# Patient Record
Sex: Male | Born: 1980 | Race: Black or African American | Hispanic: No | Marital: Single | Smoking: Former smoker
Health system: Southern US, Community
[De-identification: ages and names within clinical notes are randomized; demographics above are authoritative.]

---

## 2007-06-02 ENCOUNTER — Emergency Department (HOSPITAL_COMMUNITY): Admission: EM | Admit: 2007-06-02 | Discharge: 2007-06-02 | Payer: Self-pay | Admitting: Emergency Medicine

## 2007-06-04 ENCOUNTER — Emergency Department (HOSPITAL_COMMUNITY): Admission: EM | Admit: 2007-06-04 | Discharge: 2007-06-04 | Payer: Self-pay | Admitting: Emergency Medicine

## 2007-06-17 ENCOUNTER — Emergency Department (HOSPITAL_COMMUNITY): Admission: EM | Admit: 2007-06-17 | Discharge: 2007-06-17 | Payer: Self-pay | Admitting: Emergency Medicine

## 2008-02-12 ENCOUNTER — Other Ambulatory Visit: Payer: Self-pay

## 2008-02-12 ENCOUNTER — Emergency Department: Payer: Self-pay | Admitting: Emergency Medicine

## 2010-09-25 ENCOUNTER — Encounter: Payer: Self-pay | Admitting: *Deleted

## 2011-02-25 ENCOUNTER — Emergency Department (HOSPITAL_COMMUNITY): Payer: BC Managed Care – PPO

## 2011-02-25 ENCOUNTER — Emergency Department (HOSPITAL_COMMUNITY)
Admission: EM | Admit: 2011-02-25 | Discharge: 2011-02-25 | Disposition: A | Discharge status: Home / Self Care | Payer: BC Managed Care – PPO | Attending: Emergency Medicine | Admitting: Emergency Medicine

## 2011-02-25 DIAGNOSIS — IMO0002 Reserved for concepts with insufficient information to code with codable children: Secondary | ICD-10-CM | POA: Insufficient documentation

## 2011-02-25 DIAGNOSIS — M79609 Pain in unspecified limb: Secondary | ICD-10-CM | POA: Insufficient documentation

## 2011-03-09 ENCOUNTER — Other Ambulatory Visit: Payer: Self-pay | Admitting: Family Medicine

## 2011-03-09 DIAGNOSIS — R222 Localized swelling, mass and lump, trunk: Secondary | ICD-10-CM

## 2011-03-13 ENCOUNTER — Ambulatory Visit
Admission: RE | Admit: 2011-03-13 | Discharge: 2011-03-13 | Disposition: A | Discharge status: Home / Self Care | Payer: BC Managed Care – PPO | Source: Ambulatory Visit | Attending: Family Medicine | Admitting: Family Medicine

## 2011-03-13 DIAGNOSIS — R222 Localized swelling, mass and lump, trunk: Secondary | ICD-10-CM

## 2019-05-01 ENCOUNTER — Encounter (HOSPITAL_COMMUNITY): Payer: Self-pay

## 2019-05-01 ENCOUNTER — Emergency Department (HOSPITAL_COMMUNITY): Payer: Self-pay

## 2019-05-01 ENCOUNTER — Other Ambulatory Visit: Payer: Self-pay

## 2019-05-01 ENCOUNTER — Emergency Department (HOSPITAL_COMMUNITY)
Admission: EM | Admit: 2019-05-01 | Discharge: 2019-05-02 | Disposition: A | Discharge status: Home / Self Care | Payer: Self-pay | Attending: Emergency Medicine | Admitting: Emergency Medicine

## 2019-05-01 DIAGNOSIS — S82831A Other fracture of upper and lower end of right fibula, initial encounter for closed fracture: Secondary | ICD-10-CM

## 2019-05-01 DIAGNOSIS — S82891A Other fracture of right lower leg, initial encounter for closed fracture: Secondary | ICD-10-CM

## 2019-05-01 DIAGNOSIS — S82301A Unspecified fracture of lower end of right tibia, initial encounter for closed fracture: Secondary | ICD-10-CM

## 2019-05-01 DIAGNOSIS — F909 Attention-deficit hyperactivity disorder, unspecified type: Secondary | ICD-10-CM | POA: Insufficient documentation

## 2019-05-01 DIAGNOSIS — M25571 Pain in right ankle and joints of right foot: Secondary | ICD-10-CM

## 2019-05-01 DIAGNOSIS — G43009 Migraine without aura, not intractable, without status migrainosus: Secondary | ICD-10-CM | POA: Insufficient documentation

## 2019-05-01 DIAGNOSIS — F913 Oppositional defiant disorder: Secondary | ICD-10-CM | POA: Insufficient documentation

## 2019-05-01 MED ORDER — OXYCODONE HCL 5 MG PO TABS: 5.0000 mg | ORAL_TABLET | Freq: Four times a day (QID) | ORAL | 0 refills | Status: AC | PRN

## 2019-05-01 MED ORDER — MORPHINE SULFATE (PF) 4 MG/ML IV SOLN
4.0000 mg | Freq: Once | INTRAVENOUS | Status: AC
  Administered 2019-05-01: 22:00:00 4 mg via INTRAVENOUS
  Filled 2019-05-01: qty 1

## 2019-05-01 MED ORDER — MORPHINE SULFATE (PF) 4 MG/ML IV SOLN
4.0000 mg | Freq: Once | INTRAVENOUS | Status: AC
  Administered 2019-05-01: 4 mg via INTRAVENOUS
  Filled 2019-05-01: qty 1

## 2019-05-01 MED ORDER — MORPHINE SULFATE (PF) 4 MG/ML IV SOLN
4.0000 mg | Freq: Once | INTRAVENOUS | Status: AC
  Administered 2019-05-01: 21:00:00 4 mg via INTRAVENOUS
  Filled 2019-05-01: qty 1

## 2019-05-01 MED ORDER — OXYCODONE-ACETAMINOPHEN 5-325 MG PO TABS
1.0000 | ORAL_TABLET | Freq: Once | ORAL | Status: AC
  Administered 2019-05-01: 1 via ORAL
  Filled 2019-05-01: qty 1

## 2019-05-01 MED ORDER — FENTANYL CITRATE (PF) 100 MCG/2ML IJ SOLN
50.0000 ug | Freq: Once | INTRAMUSCULAR | Status: AC
  Administered 2019-05-01: 22:00:00 50 ug via INTRAVENOUS
  Filled 2019-05-01: qty 2

## 2019-05-01 NOTE — Discharge Instructions (Signed)
Tomorrow morning, please call the orthopedic doctor to schedule a follow-up appointment.  You will need surgery for your ankle fracture and they will coordinate this with the orthopedic office.  Please do not bear weight on your ankle.  Please keep the splint clean and dry, if this gets wet and will lose its integrity.  If you develop severe pain, swelling, numbness or tingling in your foot, ankle, please return to ER for recheck.  Recommendation Tylenol Motrin scheduled for the next couple days as discussed.  For breakthrough pain, please use oxycodone.  Please note that this pain medication can make you drowsy and should not be taken while operating heavy machinery or driving.

## 2019-05-01 NOTE — ED Notes (Signed)
Ernest Mcfarland [id number]
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
## 2019-05-01 NOTE — Progress Notes (Signed)
Orthopedic Tech Progress Note Patient Details:  Ernest Mcfarland [birth year] [id number]  Ortho Devices Type of Ortho Device: Post (short leg) splint Ortho Device/Splint Interventions: Adjustment, Application, Ordered   Post Interventions Patient Tolerated: Well Instructions Provided: Poper ambulation with device, Care of device, Adjustment of device      Post Interventions Patient Tolerated: Well Instructions Provided: Poper ambulation with device, Care of device, Adjustment of device   Melony Overly T 05/01/2019, 10:30 PM

## 2019-05-09 NOTE — Pre-Procedure Instructions (Addendum)
Marland McalpineVictor D Decamp  05/09/2019      [id number] [place], [place] - [address] AT [facility] [address] [facility] [address] [place] [id number] Phone: [phone] Fax: [phone]  [id number] [place], [place] - [address] AT [facility] [address] [place] [address] Phone: [phone] Fax: [phone]    Your procedure is scheduled on Sept. 10  [facility]:30 [facility].M.  Call this number if you have problems the morning of surgery:  [phone]   Remember:  Do not eat after midnight.  You may drink clear liquids until 7:30 A.M.  Clear liquids allowed are:                    Water, Juice (non-citric and without pulp), Carbonated beverages, Clear Tea, Black Coffee only, Plain Jell-O only, Gatorade and Plain Popsicles only        Please complete your PRE-SURGERY ENSURE that was provided to you by 7:30 the morning of surgery.  Please, if able, drink it in one setting. DO NOT SIP.    Take these medicines the morning of surgery with A SIP OF WATER -- none            7 days prior to surgery STOP taking any Aspirin (unless otherwise instructed by your surgeon), Aleve, Naproxen, Ibuprofen, Motrin, Advil, Goody's, BC's, all herbal medications, fish oil, and all vitamins.    Do not wear jewelry.  Do not wear lotions, powders, or perfumes, or deodorant.  Do not shave 48 hours prior to surgery.  Men may shave face and neck.  Do not bring valuables to the hospital.  [facility] is not responsible for any belongings or valuables.  Contacts, dentures or bridgework may not be worn into surgery.  Leave your suitcase in the car.  After surgery it may be brought to your room.  For patients admitted to the hospital, discharge time will be determined by your treatment team.  Patients discharged the day of surgery will not be allowed to drive home.    Special instructions:   - Preparing For Surgery  Before surgery, you can play an important role. Because skin is not sterile, your skin needs to be as free of germs as possible. You can reduce the number of germs on your skin by washing with CHG (chlorahexidine gluconate) Soap before surgery.  CHG is an antiseptic cleaner which kills germs and bonds with the skin to continue killing germs even after washing.    Oral Hygiene is also important to reduce your risk of infection.  Remember - BRUSH YOUR TEETH THE MORNING OF SURGERY WITH YOUR REGULAR TOOTHPASTE  Please do not use if you have an allergy to CHG or antibacterial soaps. If your skin becomes reddened/irritated stop using the CHG.  Do not shave (including legs and underarms) for at least 48 hours prior to first CHG shower. It is OK to shave your face.  Please follow these instructions carefully.   1. Shower the NIGHT BEFORE SURGERY and the MORNING OF SURGERY with CHG.   2. If you chose to wash your hair, wash your hair first as usual with your normal shampoo.  3. After you shampoo, rinse your hair and body thoroughly to remove the shampoo.  4. Use CHG as you would any other liquid soap. You can apply  CHG directly to the skin and wash gently with a scrungie or a clean washcloth.   5. Apply the CHG Soap to your body ONLY FROM THE NECK DOWN.  Do not use on open wounds or open sores. Avoid contact with your eyes, ears, mouth and genitals (private parts). Wash Face and genitals (private parts)  with your normal soap.  6. Wash thoroughly, paying special attention to the area where your surgery will be performed.  7. Thoroughly rinse your body with warm water from the neck down.  8. DO NOT shower/wash with your normal soap after using and rinsing off the CHG Soap.  9. Pat yourself dry with a CLEAN TOWEL.  10. Wear CLEAN PAJAMAS to bed the night before surgery, wear comfortable clothes the morning of surgery  11. Place CLEAN SHEETS on your bed the  night of your first shower and DO NOT SLEEP WITH PETS.    Day of Surgery:  Do not apply any deodorants/lotions.  Please wear clean clothes to the hospital/surgery center.   Remember to brush your teeth WITH YOUR REGULAR TOOTHPASTE.    Please read over the following fact sheets that you were given. Coughing and Deep Breathing and Surgical Site Infection Prevention

## 2019-05-13 ENCOUNTER — Other Ambulatory Visit: Payer: Self-pay

## 2019-05-13 ENCOUNTER — Encounter (HOSPITAL_COMMUNITY)
Admission: RE | Admit: 2019-05-13 | Discharge: 2019-05-13 | Disposition: A | Discharge status: Home / Self Care | Payer: Medicaid Other | Source: Ambulatory Visit | Attending: Orthopedic Surgery | Admitting: Orthopedic Surgery

## 2019-05-13 ENCOUNTER — Other Ambulatory Visit (HOSPITAL_COMMUNITY)
Admission: RE | Admit: 2019-05-13 | Discharge: 2019-05-13 | Disposition: A | Discharge status: Home / Self Care | Payer: HRSA Program | Source: Ambulatory Visit | Attending: Orthopedic Surgery | Admitting: Orthopedic Surgery

## 2019-05-13 ENCOUNTER — Encounter (HOSPITAL_COMMUNITY): Payer: Self-pay

## 2019-05-13 DIAGNOSIS — Z20828 Contact with and (suspected) exposure to other viral communicable diseases: Secondary | ICD-10-CM | POA: Insufficient documentation

## 2019-05-13 DIAGNOSIS — Z01812 Encounter for preprocedural laboratory examination: Secondary | ICD-10-CM | POA: Insufficient documentation

## 2019-05-13 DIAGNOSIS — S82871A Displaced pilon fracture of right tibia, initial encounter for closed fracture: Secondary | ICD-10-CM | POA: Diagnosis present

## 2019-05-13 DIAGNOSIS — S82831A Other fracture of upper and lower end of right fibula, initial encounter for closed fracture: Secondary | ICD-10-CM | POA: Diagnosis not present

## 2019-05-13 DIAGNOSIS — Z87891 Personal history of nicotine dependence: Secondary | ICD-10-CM | POA: Diagnosis not present

## 2019-05-13 LAB — SURGICAL PCR SCREEN
MRSA, PCR: NEGATIVE
Staphylococcus aureus: POSITIVE — AB

## 2019-05-13 LAB — HEMOGLOBIN: Hemoglobin: 14.6 g/dL (ref 13.0–17.0)

## 2019-05-13 NOTE — Progress Notes (Signed)
PCP - none Cardiologist - na  Chest x-ray - na EKG - na Stress Test - na ECHO - na Cardiac Cath - na  Sleep Study - na CPAP - na  Fasting Blood Sugar - na Checks Blood Sugar _____ times a day  Blood Thinner Instructions: Aspirin Instructions:na  Anesthesia review:   Patient denies shortness of breath, fever, cough and chest pain at PAT appointment   Patient verbalized understanding of instructions that were given to them at the PAT appointment. Patient was also instructed that they will need to review over the PAT instructions again at home before surgery.

## 2019-05-14 LAB — SARS CORONAVIRUS 2 (TAT 6-24 HRS): SARS Coronavirus 2: NEGATIVE

## 2019-05-15 ENCOUNTER — Ambulatory Visit (HOSPITAL_COMMUNITY): Payer: Medicaid Other | Admitting: Certified Registered"

## 2019-05-15 ENCOUNTER — Other Ambulatory Visit: Payer: Self-pay

## 2019-05-15 ENCOUNTER — Encounter (HOSPITAL_COMMUNITY): Payer: Self-pay | Admitting: Anesthesiology

## 2019-05-15 ENCOUNTER — Encounter (HOSPITAL_COMMUNITY)
Admission: RE | Disposition: A | Discharge status: Home / Self Care | Payer: Self-pay | Source: Home / Self Care | Attending: Orthopedic Surgery

## 2019-05-15 ENCOUNTER — Ambulatory Visit (HOSPITAL_COMMUNITY): Payer: Medicaid Other

## 2019-05-15 ENCOUNTER — Ambulatory Visit (HOSPITAL_COMMUNITY)
Admission: RE | Admit: 2019-05-15 | Discharge: 2019-05-15 | Disposition: A | Discharge status: Home / Self Care | Payer: Medicaid Other | Attending: Orthopedic Surgery | Admitting: Orthopedic Surgery

## 2019-05-15 DIAGNOSIS — S82831A Other fracture of upper and lower end of right fibula, initial encounter for closed fracture: Secondary | ICD-10-CM | POA: Insufficient documentation

## 2019-05-15 DIAGNOSIS — S82871A Displaced pilon fracture of right tibia, initial encounter for closed fracture: Secondary | ICD-10-CM | POA: Insufficient documentation

## 2019-05-15 DIAGNOSIS — Z87891 Personal history of nicotine dependence: Secondary | ICD-10-CM | POA: Diagnosis not present

## 2019-05-15 DIAGNOSIS — Z419 Encounter for procedure for purposes other than remedying health state, unspecified: Secondary | ICD-10-CM

## 2019-05-15 HISTORY — PX: ORIF FIBULA FRACTURE: SHX5114

## 2019-05-15 SURGERY — OPEN REDUCTION INTERNAL FIXATION (ORIF) FIBULA FRACTURE
Anesthesia: General | Laterality: Right

## 2019-05-15 MED ORDER — MIDAZOLAM HCL 5 MG/5ML IJ SOLN
INTRAMUSCULAR | Status: DC | PRN
  Administered 2019-05-15: 2 mg via INTRAVENOUS

## 2019-05-15 MED ORDER — BUPIVACAINE HCL (PF) 0.25 % IJ SOLN
INTRAMUSCULAR | Status: AC
  Filled 2019-05-15: qty 30

## 2019-05-15 MED ORDER — ONDANSETRON 4 MG PO TBDP: 4.0000 mg | ORAL_TABLET | Freq: Three times a day (TID) | ORAL | 0 refills | Status: DC | PRN

## 2019-05-15 MED ORDER — PROMETHAZINE HCL 25 MG/ML IJ SOLN: 6.2500 mg | INTRAMUSCULAR | Status: DC | PRN

## 2019-05-15 MED ORDER — LACTATED RINGERS IV SOLN
INTRAVENOUS | Status: DC
  Administered 2019-05-15: 10:00:00 via INTRAVENOUS

## 2019-05-15 MED ORDER — 0.9 % SODIUM CHLORIDE (POUR BTL) OPTIME
TOPICAL | Status: DC | PRN
  Administered 2019-05-15: 1000 mL

## 2019-05-15 MED ORDER — HYDROMORPHONE HCL 1 MG/ML IJ SOLN
INTRAMUSCULAR | Status: AC
  Filled 2019-05-15: qty 1

## 2019-05-15 MED ORDER — HYDROMORPHONE HCL 1 MG/ML IJ SOLN
0.2500 mg | INTRAMUSCULAR | Status: DC | PRN
  Administered 2019-05-15 (×2): 0.5 mg via INTRAVENOUS
  Administered 2019-05-15 (×2): 1 mg via INTRAVENOUS

## 2019-05-15 MED ORDER — HYDROMORPHONE HCL 1 MG/ML IJ SOLN
INTRAMUSCULAR | Status: AC
  Filled 2019-05-15: qty 0.5

## 2019-05-15 MED ORDER — BUPIVACAINE-EPINEPHRINE (PF) 0.5% -1:200000 IJ SOLN
INTRAMUSCULAR | Status: DC | PRN
  Administered 2019-05-15: 30 mL via PERINEURAL

## 2019-05-15 MED ORDER — DIPHENHYDRAMINE HCL 50 MG/ML IJ SOLN
25.0000 mg | Freq: Once | INTRAMUSCULAR | Status: AC
  Administered 2019-05-15: 15:00:00 25 mg via INTRAVENOUS

## 2019-05-15 MED ORDER — LIDOCAINE 2% (20 MG/ML) 5 ML SYRINGE
INTRAMUSCULAR | Status: DC | PRN
  Administered 2019-05-15: 100 mg via INTRAVENOUS

## 2019-05-15 MED ORDER — HYDROMORPHONE HCL 1 MG/ML IJ SOLN
0.5000 mg | INTRAMUSCULAR | Status: DC | PRN
  Administered 2019-05-15: 1 mg via INTRAVENOUS

## 2019-05-15 MED ORDER — CEFAZOLIN SODIUM-DEXTROSE 2-4 GM/100ML-% IV SOLN: 2.0000 g | INTRAVENOUS | Status: DC

## 2019-05-15 MED ORDER — PROPOFOL 10 MG/ML IV BOLUS
INTRAVENOUS | Status: AC
  Filled 2019-05-15: qty 20

## 2019-05-15 MED ORDER — KETAMINE HCL 50 MG/5ML IJ SOSY
PREFILLED_SYRINGE | INTRAMUSCULAR | Status: AC
  Filled 2019-05-15: qty 5

## 2019-05-15 MED ORDER — ONDANSETRON HCL 4 MG/2ML IJ SOLN
INTRAMUSCULAR | Status: DC | PRN
  Administered 2019-05-15: 4 mg via INTRAVENOUS

## 2019-05-15 MED ORDER — DEXAMETHASONE SODIUM PHOSPHATE 10 MG/ML IJ SOLN
INTRAMUSCULAR | Status: DC | PRN
  Administered 2019-05-15: 10 mg via INTRAVENOUS

## 2019-05-15 MED ORDER — FENTANYL CITRATE (PF) 250 MCG/5ML IJ SOLN
INTRAMUSCULAR | Status: DC | PRN
  Administered 2019-05-15: 25 ug via INTRAVENOUS
  Administered 2019-05-15: 50 ug via INTRAVENOUS
  Administered 2019-05-15: 250 ug via INTRAVENOUS
  Administered 2019-05-15 (×2): 25 ug via INTRAVENOUS

## 2019-05-15 MED ORDER — FENTANYL CITRATE (PF) 250 MCG/5ML IJ SOLN
INTRAMUSCULAR | Status: AC
  Filled 2019-05-15: qty 5

## 2019-05-15 MED ORDER — MEPERIDINE HCL 25 MG/ML IJ SOLN: 6.2500 mg | INTRAMUSCULAR | Status: DC | PRN

## 2019-05-15 MED ORDER — PROPOFOL 10 MG/ML IV BOLUS
INTRAVENOUS | Status: DC | PRN
  Administered 2019-05-15: 20 mg via INTRAVENOUS
  Administered 2019-05-15: 280 mg via INTRAVENOUS
  Administered 2019-05-15: 20 mg via INTRAVENOUS

## 2019-05-15 MED ORDER — HYDROMORPHONE HCL 2 MG PO TABS: 2.0000 mg | ORAL_TABLET | ORAL | 0 refills | Status: AC | PRN

## 2019-05-15 MED ORDER — DIPHENHYDRAMINE HCL 50 MG/ML IJ SOLN
INTRAMUSCULAR | Status: AC
  Filled 2019-05-15: qty 1

## 2019-05-15 MED ORDER — ACETAMINOPHEN 10 MG/ML IV SOLN
INTRAVENOUS | Status: DC | PRN
  Administered 2019-05-15: 1000 mg via INTRAVENOUS

## 2019-05-15 MED ORDER — KETAMINE HCL 10 MG/ML IJ SOLN
INTRAMUSCULAR | Status: DC | PRN
  Administered 2019-05-15: 10 mg via INTRAVENOUS
  Administered 2019-05-15: 30 mg via INTRAVENOUS

## 2019-05-15 MED ORDER — CHLORHEXIDINE GLUCONATE 4 % EX LIQD: 60.0000 mL | Freq: Once | CUTANEOUS | Status: DC

## 2019-05-15 MED ORDER — HYDROMORPHONE HCL 1 MG/ML IJ SOLN
INTRAMUSCULAR | Status: DC | PRN
  Administered 2019-05-15: 0.5 mg via INTRAVENOUS

## 2019-05-15 MED ORDER — MIDAZOLAM HCL 2 MG/2ML IJ SOLN
INTRAMUSCULAR | Status: AC
  Filled 2019-05-15: qty 2

## 2019-05-15 MED ORDER — HYDROMORPHONE HCL 1 MG/ML IJ SOLN
INTRAMUSCULAR | Status: AC
  Filled 2019-05-15: qty 2

## 2019-05-15 MED ORDER — BUPIVACAINE-EPINEPHRINE (PF) 0.25% -1:200000 IJ SOLN
INTRAMUSCULAR | Status: DC | PRN
  Administered 2019-05-15: 21 mL

## 2019-05-15 MED ORDER — BUPIVACAINE-EPINEPHRINE (PF) 0.25% -1:200000 IJ SOLN
INTRAMUSCULAR | Status: AC
  Filled 2019-05-15: qty 30

## 2019-05-15 SURGICAL SUPPLY — 68 items
ALCOHOL 70% 16 OZ (MISCELLANEOUS) ×3 IMPLANT
BANDAGE ESMARK 6X9 LF (GAUZE/BANDAGES/DRESSINGS) IMPLANT
BIT DRILL 2.5X110 QC LCP DISP (BIT) ×3 IMPLANT
BIT DRILL QC 3.5X110 (BIT) ×3 IMPLANT
BNDG COHESIVE 4X5 TAN STRL (GAUZE/BANDAGES/DRESSINGS) ×3 IMPLANT
BNDG ELASTIC 4X5.8 VLCR STR LF (GAUZE/BANDAGES/DRESSINGS) ×3 IMPLANT
BNDG ELASTIC 6X5.8 VLCR STR LF (GAUZE/BANDAGES/DRESSINGS) ×3 IMPLANT
BNDG ESMARK 6X9 LF (GAUZE/BANDAGES/DRESSINGS)
CANISTER SUCT 3000ML PPV (MISCELLANEOUS) ×3 IMPLANT
COVER SURGICAL LIGHT HANDLE (MISCELLANEOUS) ×3 IMPLANT
COVER WAND RF STERILE (DRAPES) ×3 IMPLANT
CUFF TOURN SGL QUICK 34 (TOURNIQUET CUFF)
CUFF TRNQT CYL 34X4.125X (TOURNIQUET CUFF) IMPLANT
DRAPE C-ARM 42X72 X-RAY (DRAPES) ×3 IMPLANT
DRAPE C-ARMOR (DRAPES) ×3 IMPLANT
DRAPE U-SHAPE 47X51 STRL (DRAPES) ×3 IMPLANT
DRSG ADAPTIC 3X8 NADH LF (GAUZE/BANDAGES/DRESSINGS) ×3 IMPLANT
DRSG PAD ABDOMINAL 8X10 ST (GAUZE/BANDAGES/DRESSINGS) ×3 IMPLANT
DURAPREP 26ML APPLICATOR (WOUND CARE) ×3 IMPLANT
ELECT REM PT RETURN 9FT ADLT (ELECTROSURGICAL) ×3
ELECTRODE REM PT RTRN 9FT ADLT (ELECTROSURGICAL) ×1 IMPLANT
GAUZE SPONGE 4X4 12PLY STRL (GAUZE/BANDAGES/DRESSINGS) ×3 IMPLANT
GLOVE BIO SURGEON STRL SZ7.5 (GLOVE) ×3 IMPLANT
GLOVE BIOGEL PI IND STRL 8 (GLOVE) ×1 IMPLANT
GLOVE BIOGEL PI INDICATOR 8 (GLOVE) ×2
GOWN STRL REUS W/ TWL LRG LVL3 (GOWN DISPOSABLE) ×2 IMPLANT
GOWN STRL REUS W/ TWL XL LVL3 (GOWN DISPOSABLE) ×1 IMPLANT
GOWN STRL REUS W/TWL LRG LVL3 (GOWN DISPOSABLE) ×4
GOWN STRL REUS W/TWL XL LVL3 (GOWN DISPOSABLE) ×2
GUIDEWARE NON THREAD 1.25X150 (WIRE) ×3
GUIDEWIRE NON THREAD 1.25X150 (WIRE) ×1 IMPLANT
KIT 1/3 TUB PL 2H 25M (Orthopedic Implant) ×1 IMPLANT
KIT BASIN OR (CUSTOM PROCEDURE TRAY) ×3 IMPLANT
KIT TURNOVER KIT B (KITS) ×3 IMPLANT
NEEDLE HYPO 25GX1X1/2 BEV (NEEDLE) IMPLANT
NS IRRIG 1000ML POUR BTL (IV SOLUTION) ×3 IMPLANT
PACK ORTHO EXTREMITY (CUSTOM PROCEDURE TRAY) ×3 IMPLANT
PAD ARMBOARD 7.5X6 YLW CONV (MISCELLANEOUS) ×6 IMPLANT
PAD CAST 4YDX4 CTTN HI CHSV (CAST SUPPLIES) ×1 IMPLANT
PADDING CAST COTTON 4X4 STRL (CAST SUPPLIES) ×2
PADDING CAST COTTON 6X4 STRL (CAST SUPPLIES) ×3 IMPLANT
PROS 1/3 TUB PL 2H 25M (Orthopedic Implant) ×3 IMPLANT
SCREW CANC FT ST SFS 4X14 (Screw) ×3 IMPLANT
SCREW CANC FT ST SFS 4X16 (Screw) ×3 IMPLANT
SCREW CORTEX 3.5 14MM (Screw) ×6 IMPLANT
SCREW CORTEX 3.5 24MM (Screw) ×2 IMPLANT
SCREW CORTEX 3.5 28MM (Screw) ×2 IMPLANT
SCREW CORTEX 3.5 38MM (Screw) ×2 IMPLANT
SCREW CORTEX 3.5 45MM (Screw) ×3 IMPLANT
SCREW LOCK CORT ST 3.5X14 (Screw) ×3 IMPLANT
SCREW LOCK CORT ST 3.5X24 (Screw) ×1 IMPLANT
SCREW LOCK CORT ST 3.5X28 (Screw) ×1 IMPLANT
SCREW LOCK CORT ST 3.5X38 (Screw) ×1 IMPLANT
SCREW SHORT THREAD 4.0X40 (Screw) ×3 IMPLANT
SCREW SHORT THREAD 4.0X42 (Screw) ×3 IMPLANT
SPONGE LAP 18X18 RF (DISPOSABLE) IMPLANT
SUCTION FRAZIER HANDLE 10FR (MISCELLANEOUS) ×2
SUCTION TUBE FRAZIER 10FR DISP (MISCELLANEOUS) ×1 IMPLANT
SUT ETHILON 3 0 PS 1 (SUTURE) ×6 IMPLANT
SUT VIC AB 0 CT1 27 (SUTURE)
SUT VIC AB 0 CT1 27XBRD ANBCTR (SUTURE) IMPLANT
SUT VIC AB 2-0 CT1 27 (SUTURE) ×4
SUT VIC AB 2-0 CT1 TAPERPNT 27 (SUTURE) ×2 IMPLANT
SYR CONTROL 10ML LL (SYRINGE) IMPLANT
TOWEL GREEN STERILE (TOWEL DISPOSABLE) ×6 IMPLANT
TUBE CONNECTING 12'X1/4 (SUCTIONS) ×1
TUBE CONNECTING 12X1/4 (SUCTIONS) ×2 IMPLANT
YANKAUER SUCT BULB TIP NO VENT (SUCTIONS) ×3 IMPLANT
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
## 2019-05-15 NOTE — Anesthesia Preprocedure Evaluation (Signed)
Anesthesia Evaluation  Patient identified by MRN, date of birth, ID band Patient awake    Reviewed: Allergy & Precautions, NPO status , Patient's Chart, lab work & pertinent test results  Airway Mallampati: II  TM Distance: >3 FB Neck ROM: Full    Dental no notable dental hx. (+) Teeth Intact   Pulmonary former smoker,    Pulmonary exam normal breath sounds clear to auscultation       Cardiovascular negative cardio ROS Normal cardiovascular exam Rhythm:Regular Rate:Normal     Neuro/Psych negative neurological ROS  negative psych ROS   GI/Hepatic negative GI ROS, Neg liver ROS,   Endo/Other  negative endocrine ROS  Renal/GU negative Renal ROS  negative genitourinary   Musculoskeletal Right Ankle Pilon Fx with Fx Fibula   Abdominal   Peds  Hematology negative hematology ROS (+)   Anesthesia Other Findings   Reproductive/Obstetrics                             Anesthesia Physical Anesthesia Plan  ASA: I  Anesthesia Plan: General   Post-op Pain Management:    Induction: Intravenous  PONV Risk Score and Plan: 3 and Midazolam, Ondansetron, Treatment may vary due to age or medical condition and Dexamethasone  Airway Management Planned: LMA  Additional Equipment:   Intra-op Plan:   Post-operative Plan: Extubation in OR  Informed Consent: I have reviewed the patients History and Physical, chart, labs and discussed the procedure including the risks, benefits and alternatives for the proposed anesthesia with the patient or authorized representative who has indicated his/her understanding and acceptance.     Dental advisory given  Plan Discussed with: CRNA and Surgeon  Anesthesia Plan Comments:         Anesthesia Quick Evaluation

## 2019-05-15 NOTE — Brief Op Note (Signed)
05/15/2019  11:53 AM  PATIENT:  Ernest Mcfarland  38 y.o. male  PRE-OPERATIVE DIAGNOSIS:  Right ankle pilon fracture with fibula fracture  POST-OPERATIVE DIAGNOSIS:  Right ankle pilon fracture with fibula fracture  PROCEDURE:  Procedure(s): OPEN REDUCTION INTERNAL FIXATION (ORIF) PILON AND  FIBULA FRACTURE (Right)  SURGEON:  Surgeon(s) and Role:    Nicholes Stairs, MD - Primary  PHYSICIAN ASSISTANT:   ASSISTANTS: April Green, RNFA   ANESTHESIA:   local and general  EBL:  10 mL   BLOOD ADMINISTERED:none  DRAINS: none   LOCAL MEDICATIONS USED:  MARCAINE     SPECIMEN:  No Specimen  DISPOSITION OF SPECIMEN:  N/A  COUNTS:  YES  TOURNIQUET:   Total Tourniquet Time Documented: Thigh (Right) - 70 minutes Total: Thigh (Right) - 70 minutes   DICTATION: .Note written in EPIC  PLAN OF CARE: Discharge to home after PACU  PATIENT DISPOSITION:  PACU - hemodynamically stable.   Delay start of Pharmacological VTE agent (>24hrs) due to surgical blood loss or risk of bleeding: not applicable

## 2019-05-15 NOTE — Transfer of Care (Signed)
Immediate Anesthesia Transfer of Care Note  Patient: Ernest Mcfarland  Procedure(s) Performed: OPEN REDUCTION INTERNAL FIXATION (ORIF) PILON AND  FIBULA FRACTURE (Right )  Patient Location: PACU  Anesthesia Type:GA  Level of Consciousness: drowsy  Airway & Oxygen Therapy: Patient Spontanous Breathing and Patient connected to face mask oxygen  Post-op Assessment: Report given to RN and Post -op Vital signs reviewed and stable  Post vital signs: Reviewed and stable  Last Vitals:  Vitals Value Taken Time  BP 148/95 05/15/19 1200  Temp 36.3 C 05/15/19 1200  Pulse 101 05/15/19 1201  Resp 13 05/15/19 1201  SpO2 99 % 05/15/19 1201  Vitals shown include unvalidated device data.  Last Pain:  Vitals:   05/15/19 1200  TempSrc:   PainSc: Asleep         Complications: No apparent anesthesia complications

## 2019-05-15 NOTE — Discharge Instructions (Signed)
General Anesthesia, Adult, Care After °This sheet gives you information about how to care for yourself after your procedure. Your health care provider may also give you more specific instructions. If you have problems or questions, contact your health care provider. °What can I expect after the procedure? °After the procedure, the following side effects are common: °· Pain or discomfort at the IV site. °· Nausea. °· Vomiting. °· Sore throat. °· Trouble concentrating. °· Feeling cold or chills. °· Weak or tired. °· Sleepiness and fatigue. °· Soreness and body aches. These side effects can affect parts of the body that were not involved in surgery. °Follow these instructions at home: ° °For at least 24 hours after the procedure: °· Have a responsible adult stay with you. It is important to have someone help care for you until you are awake and alert. °· Rest as needed. °· Do not: °? Participate in activities in which you could fall or become injured. °? Drive. °? Use heavy machinery. °? Drink alcohol. °? Take sleeping pills or medicines that cause drowsiness. °? Make important decisions or sign legal documents. °? Take care of children on your own. °Eating and drinking °· Follow any instructions from your health care provider about eating or drinking restrictions. °· When you feel hungry, start by eating small amounts of foods that are soft and easy to digest (bland), such as toast. Gradually return to your regular diet. °· Drink enough fluid to keep your urine pale yellow. °· If you vomit, rehydrate by drinking water, juice, or clear broth. °General instructions °· If you have sleep apnea, surgery and certain medicines can increase your risk for breathing problems. Follow instructions from your health care provider about wearing your sleep device: °? Anytime you are sleeping, including during daytime naps. °? While taking prescription pain medicines, sleeping medicines, or medicines that make you drowsy. °· Return to  your normal activities as told by your health care provider. Ask your health care provider what activities are safe for you. °· Take over-the-counter and prescription medicines only as told by your health care provider. °· If you smoke, do not smoke without supervision. °· Keep all follow-up visits as told by your health care provider. This is important. °Contact a health care provider if: °· You have nausea or vomiting that does not get better with medicine. °· You cannot eat or drink without vomiting. °· You have pain that does not get better with medicine. °· You are unable to pass urine. °· You develop a skin rash. °· You have a fever. °· You have redness around your IV site that gets worse. °Get help right away if: °· You have difficulty breathing. °· You have chest pain. °· You have blood in your urine or stool, or you vomit blood. °Summary °· After the procedure, it is common to have a sore throat or nausea. It is also common to feel tired. °· Have a responsible adult stay with you for the first 24 hours after general anesthesia. It is important to have someone help care for you until you are awake and alert. °· When you feel hungry, start by eating small amounts of foods that are soft and easy to digest (bland), such as toast. Gradually return to your regular diet. °· Drink enough fluid to keep your urine pale yellow. °· Return to your normal activities as told by your health care provider. Ask your health care provider what activities are safe for you. °This information is not   intended to replace advice given to you by your health care provider. Make sure you discuss any questions you have with your health care provider. Document Released: 11/27/2000 Document Revised: 08/24/2017 Document Reviewed: 04/06/2017 Elsevier Patient Education  2020 [place] discharge instructions: -No weightbearing to the right lower extremity.  You should maintain your splint clean and dry at all  times.  -Elevate your right lower extremity with "toes above nose."  -For mild to moderate pain use Tylenol and/or Advil around-the-clock.  For breakthrough pain use Dilaudid tablets as needed.  -For the prevention of blood clots use an 81 mg aspirin once per day for the next 6 weeks.  -Return to clinic to see Dr. Stann Mainland in 2 weeks for routine postoperative care.

## 2019-05-15 NOTE — Anesthesia Procedure Notes (Signed)
Procedure Name: LMA Insertion Date/Time: 05/15/2019 10:20 AM Performed by: Cleda Daub, CRNA Pre-anesthesia Checklist: Patient identified, Emergency Drugs available, Suction available and Patient being monitored Patient Re-evaluated:Patient Re-evaluated prior to induction Oxygen Delivery Method: Circle system utilized Preoxygenation: Pre-oxygenation with 100% oxygen Induction Type: IV induction LMA: LMA inserted LMA Size: 5.0 Number of attempts: 1 Placement Confirmation: positive ETCO2 and breath sounds checked- equal and bilateral Tube secured with: Tape Dental Injury: Teeth and Oropharynx as per pre-operative assessment

## 2019-05-15 NOTE — Anesthesia Procedure Notes (Signed)
Anesthesia Regional Block: Popliteal block   Pre-Anesthetic Checklist: ,, timeout performed, Correct Patient, Correct Site, Correct Laterality, Correct Procedure, Correct Position, site marked, Risks and benefits discussed,  Surgical consent,  Pre-op evaluation,  At surgeon's request and post-op pain management  Laterality: Right  Prep: chloraprep       Needles:  Injection technique: Single-shot  Needle Type: Echogenic Stimulator Needle     Needle Length: 9cm  Needle Gauge: 21   Needle insertion depth: 7 cm   Additional Needles:   Procedures:,,,, ultrasound used (permanent image in chart),,,,  Narrative:  Start time: 05/15/2019 1:12 PM End time: 05/15/2019 1:17 PM Injection made incrementally with aspirations every 5 mL.  Performed by: Personally  Anesthesiologist: Josephine Igo, MD  Additional Notes: Timeout performed. Patient sedated. Relevant anatomy ID'd using [place]. Incremental 2-28ml injection of LA with frequent aspiration. Patient tolerated procedure well.        Right Popliteal Block

## 2019-05-15 NOTE — Anesthesia Postprocedure Evaluation (Signed)
Anesthesia Post Note  Patient: Ernest Mcfarland  Procedure(s) Performed: OPEN REDUCTION INTERNAL FIXATION (ORIF) PILON AND  FIBULA FRACTURE (Right )     Patient location during evaluation: PACU Anesthesia Type: General Level of consciousness: awake and alert and oriented Pain management: pain level controlled Vital Signs Assessment: post-procedure vital signs reviewed and stable Respiratory status: spontaneous breathing, nonlabored ventilation and respiratory function stable Cardiovascular status: blood pressure returned to baseline and stable Postop Assessment: no apparent nausea or vomiting Anesthetic complications: no    Last Vitals:  Vitals:   05/15/19 1315 05/15/19 1330  BP: 131/87   Pulse: 95 94  Resp: 17 14  Temp:    SpO2: 100% 95%    Last Pain:  Vitals:   05/15/19 1330  TempSrc:   PainSc: 4         RLE Motor Response: Purposeful movement (05/15/19 1330) RLE Sensation: Full sensation (05/15/19 1330)      Dylynn Ketner A.
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
## 2019-05-19 ENCOUNTER — Encounter (HOSPITAL_COMMUNITY): Payer: Self-pay | Admitting: Orthopedic Surgery

## 2019-06-25 ENCOUNTER — Ambulatory Visit: Payer: No Typology Code available for payment source | Admitting: Physical Therapy

## 2019-06-26 ENCOUNTER — Ambulatory Visit: Payer: Self-pay | Admitting: Physical Therapy

## 2019-06-26 ENCOUNTER — Encounter: Payer: Self-pay | Admitting: Physical Therapy

## 2019-06-26 ENCOUNTER — Other Ambulatory Visit: Payer: Self-pay

## 2019-06-26 ENCOUNTER — Ambulatory Visit: Payer: Medicaid Other | Attending: Orthopedic Surgery | Admitting: Physical Therapy

## 2019-06-26 DIAGNOSIS — R600 Localized edema: Secondary | ICD-10-CM | POA: Insufficient documentation

## 2019-06-26 DIAGNOSIS — R269 Unspecified abnormalities of gait and mobility: Secondary | ICD-10-CM | POA: Insufficient documentation

## 2019-06-26 DIAGNOSIS — M6281 Muscle weakness (generalized): Secondary | ICD-10-CM | POA: Diagnosis present

## 2019-06-26 DIAGNOSIS — M25571 Pain in right ankle and joints of right foot: Secondary | ICD-10-CM | POA: Insufficient documentation
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
## 2019-07-01 ENCOUNTER — Other Ambulatory Visit: Payer: Self-pay

## 2019-07-01 ENCOUNTER — Encounter: Payer: Self-pay | Admitting: Physical Therapy

## 2019-07-01 ENCOUNTER — Ambulatory Visit: Payer: Medicaid Other | Admitting: Physical Therapy

## 2019-07-01 DIAGNOSIS — M6281 Muscle weakness (generalized): Secondary | ICD-10-CM

## 2019-07-01 DIAGNOSIS — M25571 Pain in right ankle and joints of right foot: Secondary | ICD-10-CM | POA: Diagnosis not present

## 2019-07-01 DIAGNOSIS — R600 Localized edema: Secondary | ICD-10-CM

## 2019-07-01 DIAGNOSIS — R269 Unspecified abnormalities of gait and mobility: Secondary | ICD-10-CM

## 2019-07-01 NOTE — Therapy (Signed)
[facility] [address] [place], [place], [address] Phone: [phone]   Fax:  [phone]  Physical Therapy Treatment  Patient Details  Name: EDDY LISZEWSKI MRN: [id number] Date of Birth: 25-Mar-1981 Referring Provider (PT): Yolonda Kida, MD   Encounter Date: 07/01/2019  PT End of Session - 07/01/19 1137    Visit Number  2    Number of Visits  17    Date for PT Re-Evaluation  08/21/19    Authorization Type  Self-pay    PT Start Time  1000    PT Stop Time  1055    PT Time Calculation (min)  55 min    Activity Tolerance  Patient tolerated treatment well    Behavior During Therapy  [facility] for tasks assessed/performed       History reviewed. No pertinent past medical history.  Past Surgical History:  Procedure Laterality Date  . ORIF FIBULA FRACTURE Right 05/15/2019   Procedure: OPEN REDUCTION INTERNAL FIXATION (ORIF) PILON AND  FIBULA FRACTURE;  Surgeon: Yolonda Kida, MD;  Location: [facility] OR;  Service: Orthopedics;  Laterality: Right;    There were no vitals filed for this visit.  Subjective Assessment - 07/01/19 1005    Subjective  pt presents S/P R tibia/ fibula ORIF back on 05/15/2019 Pt amb into clinic today with no crutches and Cam Boot Walker. Pt amb with antalgic gait and hip circumduction.    How long can you sit comfortably?  unlimited    How long can you stand comfortably?  with crutches 10-20 min    How long can you walk comfortably?  with crutches10-20 min    Diagnostic tests  x-ray    Currently in Pain?  Yes    Pain Score  3     Pain Location  Ankle    Pain Orientation  Right    Pain Descriptors / Indicators  Aching;Sore;Tightness    Pain Type  Surgical pain    Pain Onset  1 to 4 weeks ago    Pain Frequency  Intermittent                       [facility] Adult PT Treatment/Exercise - 07/01/19 0001      Ambulation/Gait   Ambulation/Gait  Yes    Assistive device  None;Other  (Comment)   Cam Boot Walker   Gait Pattern  Decreased stride length;Step-through pattern;Trendelenburg;Antalgic   tall cam boot on RLE     Exercises   Exercises  Ankle      Modalities   Modalities  Vasopneumatic      Vasopneumatic   Number Minutes Vasopneumatic   10 minutes    Vasopnuematic Location   Ankle    Vasopneumatic Pressure  Low    Vasopneumatic Temperature   34      Ankle Exercises: Stretches   Plantar Fascia Stretch Limitations  using a tennis ball and rolling under foot      Ankle Exercises: Seated   ABC's  2 reps    Towel Crunch  3 reps    Towel Inversion/Eversion  5 reps    BAPS  Sitting;Level 1;15 reps    BAPS Limitations  bilateral directions    Heel Slides  10 reps;Right    Heel Slides Limitations  with towel under foot x 15    Other Seated Ankle Exercises  prostretch, rocking back and forth, holding stretch 10 seconds x 5 reps    Other Seated Ankle  Exercises  ankle inversion/eversion on towel on the floor x 1 minute               PT Short Term Goals - 07/01/19 1135      PT SHORT TERM GOAL #1   Title  pt to be I with initial HEP    Time  4    Period  Weeks    Status  On-going    Target Date  07/24/19      PT SHORT TERM GOAL #2   Title  increase Gross R ankle ROM by >/= 5 degrees in all planes with </= 4/10 to promtoe functional ROM    Time  4    Period  Weeks    Status  On-going      PT SHORT TERM GOAL #3   Title  reduce R ankle edema by >/= 4 cm to promote ankle ROM and reduce pain for functional imporovement    Time  4    Period  Weeks    Status  New    Target Date  07/24/19        PT Long Term Goals - 06/26/19 1237      PT LONG TERM GOAL #1   Title  pt to increaes R ankle DF to >/= 8 degrees, PF to >/= 45 degrees and inversion/ eversion WFL to promote functional and efficent gait    Time  8    Period  Weeks    Status  New    Target Date  08/21/19      PT LONG TERM GOAL #2   Title  increase R ankle strength to >/= 4+/5 in  all planes to promote stability with walking/ standing    Time  8    Period  Weeks    Status  New    Target Date  08/21/19      PT LONG TERM GOAL #3   Title  pt to be able to walk/ stand >/= 45 min and navigate >/= 15 steps for functional mobility/ endurance required for work related activities.    Time  8    Period  Weeks    Status  New    Target Date  08/21/19      PT LONG TERM GOAL #4   Title  pt to be I with all HEP given as of last visit to maintain and progress current level of function    Time  6    Period  Weeks    Status  New    Target Date  08/21/19            Plan - 07/01/19 1130    Clinical Impression Statement  Pt presenting today reporting 3/10 pain in R ankle. Pt amb with Cam Boot Walker and no crutches today. Pt with R LE circumduction. Pt was edu on proper gait pattern. Pt tolerating all exercises well with limited ROM. Pt with increased pain with PF compared to DF. Pt reporting 1/10 pain following treatment and vasopneumatic. Continue to progress pt toward PLOF and goals set.    Stability/Clinical Decision Making  Stable/Uncomplicated    Rehab Potential  Good    PT Frequency  2x / week    PT Duration  8 weeks    PT Treatment/Interventions  ADLs/Self Care Home Management;Cryotherapy;Electrical Stimulation;Iontophoresis 4mg /ml Dexamethasone;Moist Heat;Traction;Ultrasound;Stair training;Gait training;Therapeutic exercise;Therapeutic activities;Balance training;Neuromuscular re-education;Manual techniques;Patient/family education;Passive range of motion;Taping;Vasopneumatic Device    PT Next Visit Plan  review/ update HEP PRN,  ankle ROM/ mobs, edema reduction techniques, muscle acivtiation, game ready for pain/ edema    PT Home Exercise Plan  heel slides, anke ABC, towel scrunch, weight shifting    Consulted and Agree with Plan of Care  Patient       Patient will benefit from skilled therapeutic intervention in order to improve the following deficits and  impairments:  Abnormal gait, Decreased activity tolerance, Decreased balance, Decreased endurance, Decreased strength, Improper body mechanics, Decreased range of motion, Pain, Increased edema, Postural dysfunction  Visit Diagnosis: Pain in right ankle and joints of right foot  Muscle weakness (generalized)  Localized edema  Abnormal gait     Problem List Patient Active Problem List   Diagnosis Date Noted  . Closed right pilon fracture 05/15/2019  . Closed fracture of right distal fibula 05/15/2019    Sharmon LeydenJennifer R Terre Hanneman, PT 07/01/2019, 11:39 AM  [facility] [address] [place], [place], [address] Phone: [phone]   Fax:  [phone]  Name: Marland McalpineVictor D Denbow MRN: [id number] Date of Birth: 10/07/1980

## 2019-07-07 ENCOUNTER — Ambulatory Visit: Payer: Medicaid Other | Admitting: Physical Therapy

## 2019-07-09 ENCOUNTER — Other Ambulatory Visit: Payer: Self-pay

## 2019-07-09 ENCOUNTER — Encounter: Payer: Self-pay | Admitting: Physical Therapy

## 2019-07-09 ENCOUNTER — Ambulatory Visit: Payer: Medicaid Other | Attending: Orthopedic Surgery | Admitting: Physical Therapy

## 2019-07-09 DIAGNOSIS — M6281 Muscle weakness (generalized): Secondary | ICD-10-CM | POA: Diagnosis present

## 2019-07-09 DIAGNOSIS — M25571 Pain in right ankle and joints of right foot: Secondary | ICD-10-CM | POA: Diagnosis present

## 2019-07-09 DIAGNOSIS — R600 Localized edema: Secondary | ICD-10-CM

## 2019-07-09 DIAGNOSIS — R269 Unspecified abnormalities of gait and mobility: Secondary | ICD-10-CM | POA: Insufficient documentation

## 2019-07-09 NOTE — Therapy (Signed)
[place], [place], [address] Phone: [phone]   Fax:  [phone]  Physical Therapy Treatment  Patient Details  Name: Ernest Mcfarland MRN: [id number] Date of Birth: Apr 09, 1981 Referring Provider (PT): Nicholes Stairs, MD   Encounter Date: 07/09/2019  PT End of Session - 07/09/19 0901    Visit Number  3    Number of Visits  17    Date for PT Re-Evaluation  08/21/19    Authorization Type  Self-pay    PT Start Time  0832    PT Stop Time  0915    PT Time Calculation (min)  43 min    Activity Tolerance  Patient tolerated treatment well    Behavior During Therapy  [facility] for tasks assessed/performed       History reviewed. No pertinent past medical history.  Past Surgical History:  Procedure Laterality Date  . ORIF FIBULA FRACTURE Right 05/15/2019   Procedure: OPEN REDUCTION INTERNAL FIXATION (ORIF) PILON AND  FIBULA FRACTURE;  Surgeon: Nicholes Stairs, MD;  Location: [place];  Service: Orthopedics;  Laterality: Right;    There were no vitals filed for this visit.  Subjective Assessment - 07/09/19 0834    Subjective  Patient reports he thinks he had a set back after last visit because his ankle swellled up more and became more painful.    Currently in Pain?  Yes    Pain Score  2     Pain Location  Ankle    Pain Orientation  Right    Pain Descriptors / Indicators  Aching;Throbbing;Sharp;Sore    Pain Type  Surgical pain    Pain Onset  1 to 4 weeks ago    Pain Frequency  Intermittent         [facility] PT Assessment - 07/09/19 0001      ROM / Strength   AROM / PROM / Strength  AROM      AROM   AROM Assessment Site  Ankle    Right/Left Ankle  Right    Right Ankle Dorsiflexion  -5    Right Ankle Plantar Flexion  25                   [facility] Adult PT Treatment/Exercise - 07/09/19 0001      Ambulation/Gait   Ambulation/Gait  Yes    Ambulation/Gait Assistance  6: Modified  independent (Device/Increase time)    Assistive device  Crutches   CAM boot   Gait Comments  Patient reported increased right ankle pain so he has gone back to using the crutches with WBAT      Self-Care   Self-Care  RICE;Other Self-Care Comments;Heat/Ice Application    RICE  Ice and elevation for pain/swelling control    Heat/Ice Application  Ice    Other Self-Care Comments   Using compression stocking to help with swelling control      Exercises   Exercises  Ankle;Knee/Hip      Knee/Hip Exercises: Supine   Straight Leg Raises  20 reps      Knee/Hip Exercises: Sidelying   Hip ABduction  20 reps    Hip ADduction  20 reps      Knee/Hip Exercises: Prone   Hip Extension  20 reps      Modalities   Modalities  Vasopneumatic      Vasopneumatic   Number Minutes Vasopneumatic   10 minutes    Vasopnuematic Location   Ankle  Vasopneumatic Pressure  Medium    Vasopneumatic Temperature   34      Manual Therapy   Manual Therapy  Edema management;Passive ROM;Joint mobilization    Edema Management  Flush massage to foot/ankle with leg elevated    Joint Mobilization  Talocrural distraction, post/ant mobs    Passive ROM  Ankle DF and PF stretching      Ankle Exercises: Stretches   Gastroc Stretch  2 reps;30 seconds    Gastroc Stretch Limitations  supine with leg elevated using strap    Other Stretch  Seated PF stretch in figure-4 position      Ankle Exercises: Supine   Other Supine Ankle Exercises  Ankle pumps with leg elevated x20             PT Education - 07/09/19 0900    Education Details  HEP, stretching, swelling control using compression stocking    Person(s) Educated  Patient    Methods  Explanation;Demonstration;Verbal cues    Comprehension  Verbalized understanding;Verbal cues required;Need further instruction       PT Short Term Goals - 07/01/19 1135      PT SHORT TERM GOAL #1   Title  pt to be I with initial HEP    Time  4    Period  Weeks    Status   On-going    Target Date  07/24/19      PT SHORT TERM GOAL #2   Title  increase Gross R ankle ROM by >/= 5 degrees in all planes with </= 4/10 to promtoe functional ROM    Time  4    Period  Weeks    Status  On-going      PT SHORT TERM GOAL #3   Title  reduce R ankle edema by >/= 4 cm to promote ankle ROM and reduce pain for functional imporovement    Time  4    Period  Weeks    Status  New    Target Date  07/24/19        PT Long Term Goals - 06/26/19 1237      PT LONG TERM GOAL #1   Title  pt to increaes R ankle DF to >/= 8 degrees, PF to >/= 45 degrees and inversion/ eversion WFL to promote functional and efficent gait    Time  8    Period  Weeks    Status  New    Target Date  08/21/19      PT LONG TERM GOAL #2   Title  increase R ankle strength to >/= 4+/5 in all planes to promote stability with walking/ standing    Time  8    Period  Weeks    Status  New    Target Date  08/21/19      PT LONG TERM GOAL #3   Title  pt to be able to walk/ stand >/= 45 min and navigate >/= 15 steps for functional mobility/ endurance required for work related activities.    Time  8    Period  Weeks    Status  New    Target Date  08/21/19      PT LONG TERM GOAL #4   Title  pt to be I with all HEP given as of last visit to maintain and progress current level of function    Time  6    Period  Weeks    Status  New    Target Date  08/21/19            Plan - 07/09/19 0901    Clinical Impression Statement  Patient presented today with increased pain and swelling from over-doing his activity following last visit. Today's session focused mainly on swelling reduction and improving range of motion with gentle manual and stretching. Patient was instructed on importance of reducing swelling and stretching at home and he demonstrated proper techniques for this. He would benefit from continued skilled PT to improve his motion and return to PLOF.    PT Treatment/Interventions  ADLs/Self Care  Home Management;Cryotherapy;Electrical Stimulation;Iontophoresis 4mg /ml Dexamethasone;Moist Heat;Traction;Ultrasound;Stair training;Gait training;Therapeutic exercise;Therapeutic activities;Balance training;Neuromuscular re-education;Manual techniques;Patient/family education;Passive range of motion;Taping;Vasopneumatic Device    PT Next Visit Plan  review/ update HEP PRN, ankle ROM/ mobs, edema reduction techniques, muscle acivtiation, game ready for pain/ edema    PT Home Exercise Plan  Ankle DF stretch with strap, seated PF stretch in figure-4 position, ABCs with ankle elevated, weight shifts in all directions, 4-way hip    Consulted and Agree with Plan of Care  Patient       Patient will benefit from skilled therapeutic intervention in order to improve the following deficits and impairments:  Abnormal gait, Decreased activity tolerance, Decreased balance, Decreased endurance, Decreased strength, Improper body mechanics, Decreased range of motion, Pain, Increased edema, Postural dysfunction  Visit Diagnosis: Pain in right ankle and joints of right foot  Muscle weakness (generalized)  Localized edema  Abnormal gait     Problem List Patient Active Problem List   Diagnosis Date Noted  . Closed right pilon fracture 05/15/2019  . Closed fracture of right distal fibula 05/15/2019    Rosana Hoesampbell Candence Sease, PT, DPT, LAT, ATC 07/09/19  9:58 AM Phone: [phone] Fax: [phone]   [facility] [address] [place], [place], [address] Phone: [phone]   Fax:  [phone]  Name: Ernest Mcfarland MRN: [id number] Date of Birth: 01/16/1981

## 2019-07-09 NOTE — Patient Instructions (Signed)
Access Code: [email or web address]  URL: [email or web address]  Date: 07/09/2019  Prepared by: Hilda Blades   Exercises Long Sitting Calf Stretch with Strap - 30-60 seconds hold Seated Ankle Plantarflexion Dorsiflexion PROM - 30-60 seconds hold Supine Active Straight Leg Raise - 20 reps - 2 sets Sidelying Hip Abduction - 20 reps - 2 sets Prone Hip Extension - 20 reps - 2 sets Sidelying Hip Adduction - 20 reps - 2 sets

## 2019-07-14 ENCOUNTER — Encounter: Payer: Self-pay | Admitting: Physical Therapy

## 2019-07-14 ENCOUNTER — Ambulatory Visit: Payer: Medicaid Other | Admitting: Physical Therapy

## 2019-07-14 ENCOUNTER — Other Ambulatory Visit: Payer: Self-pay

## 2019-07-14 DIAGNOSIS — M25571 Pain in right ankle and joints of right foot: Secondary | ICD-10-CM | POA: Diagnosis not present

## 2019-07-14 DIAGNOSIS — R269 Unspecified abnormalities of gait and mobility: Secondary | ICD-10-CM

## 2019-07-14 DIAGNOSIS — R600 Localized edema: Secondary | ICD-10-CM

## 2019-07-14 DIAGNOSIS — M6281 Muscle weakness (generalized): Secondary | ICD-10-CM

## 2019-07-14 NOTE — Therapy (Signed)
[place], [place], [address] Phone: [phone]   Fax:  [phone]  Physical Therapy Treatment  Patient Details  Name: Ernest Mcfarland MRN: [id number] Date of Birth: April 10, 1981 Referring Provider (PT): Nicholes Stairs, MD   Encounter Date: 07/14/2019  PT End of Session - 07/14/19 0900    Visit Number  4    Number of Visits  17    Date for PT Re-Evaluation  08/21/19    Authorization Type  Self-pay    PT Start Time  0905    PT Stop Time  0955    PT Time Calculation (min)  50 min    Activity Tolerance  Patient tolerated treatment well    Behavior During Therapy  [facility] for tasks assessed/performed       History reviewed. No pertinent past medical history.  Past Surgical History:  Procedure Laterality Date  . ORIF FIBULA FRACTURE Right 05/15/2019   Procedure: OPEN REDUCTION INTERNAL FIXATION (ORIF) PILON AND  FIBULA FRACTURE;  Surgeon: Nicholes Stairs, MD;  Location: [place];  Service: Orthopedics;  Laterality: Right;    There were no vitals filed for this visit.  Subjective Assessment - 07/14/19 0859    Subjective  Patient reports he is doing much better than las visit. He is able to walk without crutches and has been working out the ankle a lot at home.    Currently in Pain?  Yes    Pain Score  1     Pain Location  Ankle    Pain Orientation  Right    Pain Descriptors / Indicators  Aching;Sore    Pain Type  Surgical pain    Pain Onset  1 to 4 weeks ago    Pain Frequency  Intermittent    Multiple Pain Sites  No         [facility] PT Assessment - 07/14/19 0001      ROM / Strength   AROM / PROM / Strength  AROM      AROM   AROM Assessment Site  Ankle    Right/Left Ankle  Right    Right Ankle Dorsiflexion  -3    Right Ankle Plantar Flexion  28                   [facility] Adult PT Treatment/Exercise - 07/14/19 0001      Ambulation/Gait   Ambulation/Gait  Yes    Ambulation/Gait Assistance  6: Modified independent (Device/Increase time)    Assistive device  --   tall CAM boot     Exercises   Exercises  Ankle;Knee/Hip      Knee/Hip Exercises: Supine   Straight Leg Raises  2 sets;15 reps   4 lbs     Knee/Hip Exercises: Sidelying   Hip ABduction  2 sets;15 reps   4 lbs   Hip ADduction  2 sets;15 reps   4 lbs     Knee/Hip Exercises: Prone   Hip Extension  2 sets;15 reps   4 lbs     Modalities   Modalities  Vasopneumatic      Vasopneumatic   Number Minutes Vasopneumatic   15 minutes    Vasopnuematic Location   Ankle    Vasopneumatic Pressure  Medium    Vasopneumatic Temperature   34      Manual Therapy   Manual Therapy  Edema management;Passive ROM;Joint mobilization    Edema Management  Flush massage to foot/ankle with leg  elevated    Joint Mobilization  Talocrural distraction, grade III post/ant mobs    Passive ROM  Ankle DF and PF stretching      Ankle Exercises: Stretches   Gastroc Stretch  2 reps;30 seconds    Gastroc Stretch Limitations  supine with leg elevated using strap    Other Stretch  Seated PF stretch in figure-4 position    Other Stretch  Standing gastroc and soleus stretch x30 sec      Ankle Exercises: Standing   Other Standing Ankle Exercises  Forward and lateral weight shifts x20 each      Ankle Exercises: Supine   Other Supine Ankle Exercises  Ankle pumps with leg elevated x20             PT Education - 07/14/19 0859    Education Details  HEP, stretching, swelling control    Person(s) Educated  Patient    Methods  Explanation;Demonstration;Verbal cues;Handout    Comprehension  Verbalized understanding;Verbal cues required;Need further instruction       PT Short Term Goals - 07/01/19 1135      PT SHORT TERM GOAL #1   Title  pt to be I with initial HEP    Time  4    Period  Weeks    Status  On-going    Target Date  07/24/19      PT SHORT TERM GOAL #2   Title  increase Gross R ankle ROM by  >/= 5 degrees in all planes with </= 4/10 to promtoe functional ROM    Time  4    Period  Weeks    Status  On-going      PT SHORT TERM GOAL #3   Title  reduce R ankle edema by >/= 4 cm to promote ankle ROM and reduce pain for functional imporovement    Time  4    Period  Weeks    Status  New    Target Date  07/24/19        PT Long Term Goals - 06/26/19 1237      PT LONG TERM GOAL #1   Title  pt to increaes R ankle DF to >/= 8 degrees, PF to >/= 45 degrees and inversion/ eversion WFL to promote functional and efficent gait    Time  8    Period  Weeks    Status  New    Target Date  08/21/19      PT LONG TERM GOAL #2   Title  increase R ankle strength to >/= 4+/5 in all planes to promote stability with walking/ standing    Time  8    Period  Weeks    Status  New    Target Date  08/21/19      PT LONG TERM GOAL #3   Title  pt to be able to walk/ stand >/= 45 min and navigate >/= 15 steps for functional mobility/ endurance required for work related activities.    Time  8    Period  Weeks    Status  New    Target Date  08/21/19      PT LONG TERM GOAL #4   Title  pt to be I with all HEP given as of last visit to maintain and progress current level of function    Time  6    Period  Weeks    Status  New    Target Date  08/21/19  Plan - 07/14/19 0900    Clinical Impression Statement  Patient seemed to be doing much better this visit and tolerated weight bearing exercises with no increased pain. He continues to exhibit ankle edema and limited range of motion. He was given new standing stretches to perform. He would benefit from continued skilled PT to improve his ankle motion and gait to allow for return to PLOF.    PT Treatment/Interventions  ADLs/Self Care Home Management;Cryotherapy;Electrical Stimulation;Iontophoresis 4mg /ml Dexamethasone;Moist Heat;Traction;Ultrasound;Stair training;Gait training;Therapeutic exercise;Therapeutic activities;Balance  training;Neuromuscular re-education;Manual techniques;Patient/family education;Passive range of motion;Taping;Vasopneumatic Device    PT Next Visit Plan  review/ update HEP PRN, ankle ROM/ mobs, edema reduction techniques, muscle activation, game ready for pain/ edema    PT Home Exercise Plan  Ankle DF stretch with strap, seated PF stretch in figure-4 position, ABCs with ankle elevated, weight shifts in all directions, 4-way hip, towel scrunches, standing gastroc and soleus stretch    Consulted and Agree with Plan of Care  Patient       Patient will benefit from skilled therapeutic intervention in order to improve the following deficits and impairments:  Abnormal gait, Decreased activity tolerance, Decreased balance, Decreased endurance, Decreased strength, Improper body mechanics, Decreased range of motion, Pain, Increased edema, Postural dysfunction  Visit Diagnosis: Pain in right ankle and joints of right foot  Muscle weakness (generalized)  Localized edema  Abnormal gait     Problem List Patient Active Problem List   Diagnosis Date Noted  . Closed right pilon fracture 05/15/2019  . Closed fracture of right distal fibula 05/15/2019    07/15/2019, PT, DPT, LAT, ATC 07/14/19  9:54 AM Phone: [phone] Fax: [phone]   [facility] [address] [place], [place], [place] Phone: [phone]   Fax:  [phone]  Name: Ernest Mcfarland MRN: Marland Mcalpine Date of Birth: 03/16/1981

## 2019-07-14 NOTE — Patient Instructions (Signed)
Access Code: [email or web address]  URL: [email or web address]  Date: 07/14/2019  Prepared by: Hilda Blades   Exercises Gastroc Stretch on Wall - 30 seconds hold Standing Soleus Stretch - 30 seconds hold

## 2019-07-16 ENCOUNTER — Other Ambulatory Visit: Payer: Self-pay

## 2019-07-16 ENCOUNTER — Ambulatory Visit: Payer: Medicaid Other | Admitting: Physical Therapy

## 2019-07-16 ENCOUNTER — Encounter: Payer: Self-pay | Admitting: Physical Therapy

## 2019-07-16 DIAGNOSIS — R269 Unspecified abnormalities of gait and mobility: Secondary | ICD-10-CM

## 2019-07-16 DIAGNOSIS — M25571 Pain in right ankle and joints of right foot: Secondary | ICD-10-CM

## 2019-07-16 DIAGNOSIS — M6281 Muscle weakness (generalized): Secondary | ICD-10-CM

## 2019-07-16 DIAGNOSIS — R600 Localized edema: Secondary | ICD-10-CM

## 2019-07-16 NOTE — Therapy (Signed)
[place], [place], [address] Phone: [phone]   Fax:  [phone]  Physical Therapy Treatment  Patient Details  Name: Ernest Mcfarland MRN: [id number] Date of Birth: Jul 09, 1981 Referring Provider (PT): Nicholes Stairs, MD   Encounter Date: 07/16/2019  PT End of Session - 07/16/19 1001    Visit Number  5    Number of Visits  17    Date for PT Re-Evaluation  08/21/19    Authorization Type  Self-pay    PT Start Time  0915    PT Stop Time  1005    PT Time Calculation (min)  50 min    Activity Tolerance  Patient tolerated treatment well    Behavior During Therapy  [facility] for tasks assessed/performed       History reviewed. No pertinent past medical history.  Past Surgical History:  Procedure Laterality Date  . ORIF FIBULA FRACTURE Right 05/15/2019   Procedure: OPEN REDUCTION INTERNAL FIXATION (ORIF) PILON AND  FIBULA FRACTURE;  Surgeon: Nicholes Stairs, MD;  Location: [place];  Service: Orthopedics;  Laterality: Right;    There were no vitals filed for this visit.  Subjective Assessment - 07/16/19 0920    Subjective  Patient reports he is doing well. He continues to feel stiff.    Currently in Pain?  Yes    Pain Score  1     Pain Location  Ankle    Pain Orientation  Right    Pain Descriptors / Indicators  Aching;Other (Comment)   stiff   Pain Type  Surgical pain    Pain Onset  More than a month ago    Pain Frequency  Intermittent         [facility] PT Assessment - 07/16/19 0001      ROM / Strength   AROM / PROM / Strength  AROM      AROM   AROM Assessment Site  Ankle    Right/Left Ankle  Right    Right Ankle Dorsiflexion  -3    Right Ankle Plantar Flexion  30                   [facility] Adult PT Treatment/Exercise - 07/16/19 0001      Exercises   Exercises  Ankle;Knee/Hip      Knee/Hip Exercises: Supine   Straight Leg Raises  2 sets;15 reps      Knee/Hip  Exercises: Sidelying   Hip ABduction  2 sets;15 reps    Hip ADduction  2 sets;15 reps      Knee/Hip Exercises: Prone   Hip Extension  2 sets;15 reps      Modalities   Modalities  Vasopneumatic      Vasopneumatic   Number Minutes Vasopneumatic   10 minutes    Vasopnuematic Location   Ankle    Vasopneumatic Pressure  High    Vasopneumatic Temperature   34      Manual Therapy   Manual Therapy  Joint mobilization;Passive ROM;Soft tissue mobilization    Joint Mobilization  Talocrural and subtalar distraction and grade III post/ant mobs    Soft tissue mobilization  Scar mobilization    Passive ROM  Ankle DF and PF stretching      Ankle Exercises: Stretches   Soleus Stretch  5 reps;10 seconds    Soleus Stretch Limitations  foot on table pushing knee forward    Gastroc Stretch  2 reps;60 seconds    Gastroc  Stretch Limitations  standing    Other Stretch  Seated PF stretch in figure-4 position 2x30 sec      Ankle Exercises: Aerobic   Nustep  L5 x 5 min UE/LE with focus on keep foot flat for ankle mobility      Ankle Exercises: Standing   Other Standing Ankle Exercises  Forward and lateral weight shifts x20 each      Ankle Exercises: Seated   BAPS  Sitting;Level 1;15 reps    BAPS Limitations  fwd/bwd, lateral      Ankle Exercises: Supine   Isometrics  10x5 sec in each direction             PT Education - 07/16/19 1001    Education Details  HEP, stretching, compression socks    Person(s) Educated  Patient    Methods  Explanation;Demonstration;Verbal cues;Handout    Comprehension  Verbalized understanding;Returned demonstration;Verbal cues required;Need further instruction       PT Short Term Goals - 07/01/19 1135      PT SHORT TERM GOAL #1   Title  pt to be I with initial HEP    Time  4    Period  Weeks    Status  On-going    Target Date  07/24/19      PT SHORT TERM GOAL #2   Title  increase Gross R ankle ROM by >/= 5 degrees in all planes with </= 4/10 to  promtoe functional ROM    Time  4    Period  Weeks    Status  On-going      PT SHORT TERM GOAL #3   Title  reduce R ankle edema by >/= 4 cm to promote ankle ROM and reduce pain for functional imporovement    Time  4    Period  Weeks    Status  New    Target Date  07/24/19        PT Long Term Goals - 06/26/19 1237      PT LONG TERM GOAL #1   Title  pt to increaes R ankle DF to >/= 8 degrees, PF to >/= 45 degrees and inversion/ eversion WFL to promote functional and efficent gait    Time  8    Period  Weeks    Status  New    Target Date  08/21/19      PT LONG TERM GOAL #2   Title  increase R ankle strength to >/= 4+/5 in all planes to promote stability with walking/ standing    Time  8    Period  Weeks    Status  New    Target Date  08/21/19      PT LONG TERM GOAL #3   Title  pt to be able to walk/ stand >/= 45 min and navigate >/= 15 steps for functional mobility/ endurance required for work related activities.    Time  8    Period  Weeks    Status  New    Target Date  08/21/19      PT LONG TERM GOAL #4   Title  pt to be I with all HEP given as of last visit to maintain and progress current level of function    Time  6    Period  Weeks    Status  New    Target Date  08/21/19            Plan - 07/16/19 1006  Clinical Impression Statement  Patient continues to be limited in ankle mobility and motion. He was given new ankle/calf stretches to work on at home to improve ankle dorsiflexion and he was encouraged to use compression sock to reduce swelling. He would benefit from continued skilled PT to imporve his motion and return to PLOF.    PT Treatment/Interventions  ADLs/Self Care Home Management;Cryotherapy;Electrical Stimulation;Iontophoresis 4mg /ml Dexamethasone;Moist Heat;Traction;Ultrasound;Stair training;Gait training;Therapeutic exercise;Therapeutic activities;Balance training;Neuromuscular re-education;Manual techniques;Patient/family education;Passive range  of motion;Taping;Vasopneumatic Device    PT Next Visit Plan  review/ update HEP PRN, ankle ROM/ mobs, edema reduction techniques, muscle activation, game ready for pain/ edema    PT Home Exercise Plan  Ankle DF stretch with strap, seated PF stretch in figure-4 position, ABCs with ankle elevated, weight shifts in all directions, 4-way hip, towel scrunches, standing gastroc and soleus stretch    Consulted and Agree with Plan of Care  Patient       Patient will benefit from skilled therapeutic intervention in order to improve the following deficits and impairments:  Abnormal gait, Decreased activity tolerance, Decreased balance, Decreased endurance, Decreased strength, Improper body mechanics, Decreased range of motion, Pain, Increased edema, Postural dysfunction  Visit Diagnosis: Pain in right ankle and joints of right foot  Muscle weakness (generalized)  Localized edema  Abnormal gait     Problem List Patient Active Problem List   Diagnosis Date Noted  . Closed right pilon fracture 05/15/2019  . Closed fracture of right distal fibula 05/15/2019    Rosana Hoesampbell Laszlo Ellerby, PT, DPT, LAT, ATC 07/16/19  10:55 AM Phone: [phone] Fax: [phone]   [facility] [address] [place], [place], [address] Phone: [phone]   Fax:  [phone]  Name: Ernest Mcfarland MRN: [id number] Date of Birth: 04/07/1981

## 2019-07-21 ENCOUNTER — Ambulatory Visit: Payer: Medicaid Other | Admitting: Physical Therapy

## 2019-07-21 ENCOUNTER — Other Ambulatory Visit: Payer: Self-pay

## 2019-07-21 DIAGNOSIS — R600 Localized edema: Secondary | ICD-10-CM

## 2019-07-21 DIAGNOSIS — M25571 Pain in right ankle and joints of right foot: Secondary | ICD-10-CM

## 2019-07-21 DIAGNOSIS — R269 Unspecified abnormalities of gait and mobility: Secondary | ICD-10-CM

## 2019-07-21 DIAGNOSIS — M6281 Muscle weakness (generalized): Secondary | ICD-10-CM

## 2019-07-21 NOTE — Therapy (Signed)
[place], [place], [address] Phone: [phone]   Fax:  [phone]  Physical Therapy Treatment  Patient Details  Name: Ernest Mcfarland MRN: [id number] Date of Birth: October 25, 1980 Referring Provider (PT): Nicholes Stairs, MD   Encounter Date: 07/21/2019  PT End of Session - 07/21/19 0848    Visit Number  6    Number of Visits  17    Date for PT Re-Evaluation  08/21/19    Authorization Type  Self-pay    PT Start Time  0847    PT Stop Time  0936    PT Time Calculation (min)  49 min    Activity Tolerance  Patient tolerated treatment well    Behavior During Therapy  [facility] for tasks assessed/performed       No past medical history on file.  Past Surgical History:  Procedure Laterality Date  . ORIF FIBULA FRACTURE Right 05/15/2019   Procedure: OPEN REDUCTION INTERNAL FIXATION (ORIF) PILON AND  FIBULA FRACTURE;  Surgeon: Nicholes Stairs, MD;  Location: [place];  Service: Orthopedics;  Laterality: Right;    There were no vitals filed for this visit.  Subjective Assessment - 07/21/19 0848    Subjective  "I still have trouble moving the ankle. I do get some pain with with doing too much"         [facility] PT Assessment - 07/21/19 0001      Assessment   Medical Diagnosis  S/P ORIF of R ankle tibia/ fibula    Referring Provider (PT)  Nicholes Stairs, MD                   [facility] Adult PT Treatment/Exercise - 07/21/19 0001      Knee/Hip Exercises: Standing   Other Standing Knee Exercises  self mobs using theraband 1 x 10 using blue band    Other Standing Knee Exercises  forward rocking with LLE forward focusing on pre-walking activity       Vasopneumatic   Number Minutes Vasopneumatic   10 minutes    Vasopnuematic Location   Ankle    Vasopneumatic Pressure  High      Manual Therapy   Joint Mobilization  Talocrural and subtalar distraction and grade III post/ant mobs    Passive ROM  Ankle DF and PF stretching emphasis on PF       Ankle Exercises: Seated   Other Seated Ankle Exercises  rocker board DF/PF x 2 min with bil LE (using L to assist with R)      Ankle Exercises: Stretches   Soleus Stretch  2 reps;30 seconds    Gastroc Stretch  2 reps;30 seconds               PT Short Term Goals - 07/01/19 1135      PT SHORT TERM GOAL #1   Title  pt to be I with initial HEP    Time  4    Period  Weeks    Status  On-going    Target Date  07/24/19      PT SHORT TERM GOAL #2   Title  increase Gross R ankle ROM by >/= 5 degrees in all planes with </= 4/10 to promtoe functional ROM    Time  4    Period  Weeks    Status  On-going      PT SHORT TERM GOAL #3   Title  reduce R ankle edema by >/=  4 cm to promote ankle ROM and reduce pain for functional imporovement    Time  4    Period  Weeks    Status  New    Target Date  07/24/19        PT Long Term Goals - 06/26/19 1237      PT LONG TERM GOAL #1   Title  pt to increaes R ankle DF to >/= 8 degrees, PF to >/= 45 degrees and inversion/ eversion WFL to promote functional and efficent gait    Time  8    Period  Weeks    Status  New    Target Date  08/21/19      PT LONG TERM GOAL #2   Title  increase R ankle strength to >/= 4+/5 in all planes to promote stability with walking/ standing    Time  8    Period  Weeks    Status  New    Target Date  08/21/19      PT LONG TERM GOAL #3   Title  pt to be able to walk/ stand >/= 45 min and navigate >/= 15 steps for functional mobility/ endurance required for work related activities.    Time  8    Period  Weeks    Status  New    Target Date  08/21/19      PT LONG TERM GOAL #4   Title  pt to be I with all HEP given as of last visit to maintain and progress current level of function    Time  6    Period  Weeks    Status  New    Target Date  08/21/19            Plan - 07/21/19 0940    Clinical Impression Statement  pt reports continued  ankle stiffness but reports trying to walk without the boot which was fine during the day but had soreness at the end of the day. continue focus on ankle ROM and stretching, practiced pre-gait activities to promote ankle stretching and ROM. updated HEP for ankle self mobs. conitnue vaso end of session to calm down inflammation and swelling.    PT Next Visit Plan  review/ update HEP PRN, ankle ROM/ mobs, edema reduction techniques, muscle activation, game ready for pain/ edema    PT Home Exercise Plan  Ankle DF stretch with strap, seated PF stretch in figure-4 position, ABCs with ankle elevated, weight shifts in all directions, 4-way hip, towel scrunches, standing gastroc and soleus stretch    Consulted and Agree with Plan of Care  Patient       Patient will benefit from skilled therapeutic intervention in order to improve the following deficits and impairments:  Abnormal gait, Decreased activity tolerance, Decreased balance, Decreased endurance, Decreased strength, Improper body mechanics, Decreased range of motion, Pain, Increased edema, Postural dysfunction  Visit Diagnosis: Pain in right ankle and joints of right foot  Muscle weakness (generalized)  Localized edema  Abnormal gait     Problem List Patient Active Problem List   Diagnosis Date Noted  . Closed right pilon fracture 05/15/2019  . Closed fracture of right distal fibula 05/15/2019   Lulu Riding PT, DPT, LAT, ATC  07/21/19  9:44 AM      [facility] [address] [place], [place], [address] Phone: [phone]   Fax:  [phone]  Name: Ernest Mcfarland MRN: [id number] Date of Birth: 1981-06-02

## 2019-07-23 ENCOUNTER — Ambulatory Visit: Payer: Medicaid Other | Admitting: Physical Therapy

## 2019-07-23 ENCOUNTER — Encounter: Payer: Self-pay | Admitting: Physical Therapy

## 2019-07-23 ENCOUNTER — Other Ambulatory Visit: Payer: Self-pay

## 2019-07-23 DIAGNOSIS — M6281 Muscle weakness (generalized): Secondary | ICD-10-CM

## 2019-07-23 DIAGNOSIS — R269 Unspecified abnormalities of gait and mobility: Secondary | ICD-10-CM

## 2019-07-23 DIAGNOSIS — M25571 Pain in right ankle and joints of right foot: Secondary | ICD-10-CM | POA: Diagnosis not present

## 2019-07-23 DIAGNOSIS — R600 Localized edema: Secondary | ICD-10-CM

## 2019-07-23 NOTE — Therapy (Signed)
[facility] [address] [place], [place], [address] Phone: [phone]   Fax:  [phone]  Physical Therapy Treatment  Patient Details  Name: Ernest Mcfarland MRN: [id number] Date of Birth: 08/27/1981 Referring Provider (PT): Yolonda Kidaogers, Jason Patrick, MD   Encounter Date: 07/23/2019  PT End of Session - 07/23/19 1221    Visit Number  7    Number of Visits  17    Date for PT Re-Evaluation  08/21/19    Authorization Type  Self-pay    PT Start Time  0916    PT Stop Time  0958    PT Time Calculation (min)  42 min    Activity Tolerance  Patient tolerated treatment well    Behavior During Therapy  [facility] for tasks assessed/performed       History reviewed. No pertinent past medical history.  Past Surgical History:  Procedure Laterality Date  . ORIF FIBULA FRACTURE Right 05/15/2019   Procedure: OPEN REDUCTION INTERNAL FIXATION (ORIF) PILON AND  FIBULA FRACTURE;  Surgeon: Yolonda Kidaogers, Jason Patrick, MD;  [facility];  Service: Orthopedics;  Laterality: Right;    There were no vitals filed for this visit.  Subjective Assessment - 07/23/19 1000    Subjective  Patient reports continued stiffness. He has started walking without the boot to improve his ankle motion more.    Currently in Pain?  Yes    Pain Score  1     Pain Location  Ankle    Pain Orientation  Right    Pain Descriptors / Indicators  --   stiff   Pain Type  Surgical pain    Pain Frequency  Constant         [facility] PT Assessment - 07/23/19 0001      AROM   AROM Assessment Site  Ankle    Right/Left Ankle  Right    Right Ankle Dorsiflexion  -1    Right Ankle Plantar Flexion  30                   [facility] Adult PT Treatment/Exercise - 07/23/19 0001      Self-Care   Self-Care  RICE;Heat/Ice Application;Other Self-Care Comments    RICE  Ice, compression, and elevation for pain/swelling control    Heat/Ice Application  Ice    Other Self-Care Comments    Wearing boot in community, practice walking without in home      Neuro Re-ed    Neuro Re-ed Details   Tandem balance 2x30 each, tandem walking 4x8015ft, trialed SL stance but patient unable to maintain without UE assist      Exercises   Exercises  Ankle;Knee/Hip      Manual Therapy   Manual Therapy  Joint mobilization    Joint Mobilization  Talocrural and subtalar distraction and grade III post/ant mobs, mulligan MWM for ankle DF      Ankle Exercises: Stretches   Soleus Stretch  2 reps;30 seconds    Soleus Stretch Limitations  standing    Gastroc Stretch  2 reps;30 seconds    Gastroc Stretch Limitations  standing    Other Stretch  Standing ankle DF mobs with foot on step using a strap pulling posterior/inferior x10      Ankle Exercises: Aerobic   Nustep  L5 x 5 min UE/LE with focus on keep foot flat for ankle mobility      Ankle Exercises: Seated   Other Seated Ankle Exercises  Rocker board DF/PF x 2  min with bil LE (using L to assist with R)             PT Education - 07/23/19 1218    Education Details  HEP, CAM boot use, stretching    Person(s) Educated  Patient    Methods  Explanation;Demonstration;Verbal cues;Handout    Comprehension  Verbalized understanding;Returned demonstration;Verbal cues required;Need further instruction       PT Short Term Goals - 07/23/19 1234      PT SHORT TERM GOAL #1   Title  pt to be I with initial HEP    Time  4    Period  Weeks    Status  Achieved    Target Date  07/24/19      PT SHORT TERM GOAL #2   Title  increase Gross R ankle ROM by >/= 5 degrees in all planes with </= 4/10 to promtoe functional ROM    Time  4    Period  Weeks    Status  Achieved      PT SHORT TERM GOAL #3   Title  reduce R ankle edema by >/= 4 cm to promote ankle ROM and reduce pain for functional imporovement    Time  4    Period  Weeks    Status  On-going    Target Date  07/24/19        PT Long Term Goals - 06/26/19 1237      PT LONG TERM GOAL  #1   Title  pt to increaes R ankle DF to >/= 8 degrees, PF to >/= 45 degrees and inversion/ eversion WFL to promote functional and efficent gait    Time  8    Period  Weeks    Status  New    Target Date  08/21/19      PT LONG TERM GOAL #2   Title  increase R ankle strength to >/= 4+/5 in all planes to promote stability with walking/ standing    Time  8    Period  Weeks    Status  New    Target Date  08/21/19      PT LONG TERM GOAL #3   Title  pt to be able to walk/ stand >/= 45 min and navigate >/= 15 steps for functional mobility/ endurance required for work related activities.    Time  8    Period  Weeks    Status  New    Target Date  08/21/19      PT LONG TERM GOAL #4   Title  pt to be I with all HEP given as of last visit to maintain and progress current level of function    Time  6    Period  Weeks    Status  New    Target Date  08/21/19            Plan - 07/23/19 1222    Clinical Impression Statement  Patient is progressing well with his stretching/ankle mobility exercises with no increased pain reported. His range of motion is gradually improving but continues to exhibit increased swelling. He did come to therapy without his CAM boot and was instructed to wear the boot out in the community but he could practice walking without the boot at home in the house. He does exhibit gait deviations including toe out and reduce tibial translation due to limited DF. He would benefit from continued skilled PT to progress his motion and return to PLOF.  PT Treatment/Interventions  ADLs/Self Care Home Management;Cryotherapy;Electrical Stimulation;Iontophoresis 4mg /ml Dexamethasone;Moist Heat;Traction;Ultrasound;Stair training;Gait training;Therapeutic exercise;Therapeutic activities;Balance training;Neuromuscular re-education;Manual techniques;Patient/family education;Passive range of motion;Taping;Vasopneumatic Device    PT Next Visit Plan  review/ update HEP PRN, ankle ROM/ mobs,  edema reduction techniques, muscle activation, game ready for pain/ edema    PT Home Exercise Plan  Added self ankle DF and PF mobs with band; Ankle DF stretch with strap, seated PF stretch in figure-4 position, ABCs with ankle elevated, weight shifts in all directions, 4-way hip, towel scrunches, standing gastroc and soleus stretch    Consulted and Agree with Plan of Care  Patient       Patient will benefit from skilled therapeutic intervention in order to improve the following deficits and impairments:  Abnormal gait, Decreased activity tolerance, Decreased balance, Decreased endurance, Decreased strength, Improper body mechanics, Decreased range of motion, Pain, Increased edema, Postural dysfunction  Visit Diagnosis: Pain in right ankle and joints of right foot  Muscle weakness (generalized)  Localized edema  Abnormal gait     Problem List Patient Active Problem List   Diagnosis Date Noted  . Closed right pilon fracture 05/15/2019  . Closed fracture of right distal fibula 05/15/2019    Hilda Blades, PT, DPT, LAT, ATC 07/23/19  12:51 PM Phone: [phone] Fax: [facility] [address] [place], [place], [address] Phone: [phone]   Fax:  [phone]  Name: Ernest Mcfarland MRN: [id number] Date of Birth: [birth year]

## 2019-07-23 NOTE — Patient Instructions (Signed)
Access Code: [email or web address]  URL: [email or web address]  Date: 07/23/2019  Prepared by: Hilda Blades   Exercises Walking Tandem Stance Standing Ankle Dorsiflexion Stretch on Chair

## 2019-07-28 ENCOUNTER — Ambulatory Visit: Payer: Medicaid Other | Admitting: Physical Therapy

## 2019-07-28 ENCOUNTER — Encounter: Payer: Self-pay | Admitting: Physical Therapy

## 2019-07-28 ENCOUNTER — Other Ambulatory Visit: Payer: Self-pay

## 2019-07-28 DIAGNOSIS — M25571 Pain in right ankle and joints of right foot: Secondary | ICD-10-CM

## 2019-07-28 DIAGNOSIS — R600 Localized edema: Secondary | ICD-10-CM

## 2019-07-28 DIAGNOSIS — R269 Unspecified abnormalities of gait and mobility: Secondary | ICD-10-CM

## 2019-07-28 DIAGNOSIS — M6281 Muscle weakness (generalized): Secondary | ICD-10-CM

## 2019-07-28 NOTE — Therapy (Addendum)
[place], [place], [address] Phone: [phone]   Fax:  [phone]  Physical Therapy Treatment  Discharge  Patient Details  Name: Ernest Mcfarland MRN: [id number] Date of Birth: 1980-09-13 Referring Provider (PT): Nicholes Stairs, MD   Encounter Date: 07/28/2019  PT End of Session - 07/28/19 1135    Visit Number  8    Number of Visits  17    Date for PT Re-Evaluation  08/21/19    Authorization Type  Self-pay    PT Start Time  1130    PT Stop Time  1210    PT Time Calculation (min)  40 min    Activity Tolerance  Patient tolerated treatment well    Behavior During Therapy  [facility] for tasks assessed/performed       History reviewed. No pertinent past medical history.  Past Surgical History:  Procedure Laterality Date  . ORIF FIBULA FRACTURE Right 05/15/2019   Procedure: OPEN REDUCTION INTERNAL FIXATION (ORIF) PILON AND  FIBULA FRACTURE;  Surgeon: Nicholes Stairs, MD;  Location: [facility];  Service: Orthopedics;  Laterality: Right;    There were no vitals filed for this visit.  Subjective Assessment - 07/28/19 1132    Subjective  Patient reports he is a little sore today. He notes he has good days and bad days where he feels stiff.    Currently in Pain?  Yes    Pain Score  2     Pain Location  Ankle    Pain Orientation  Right    Pain Descriptors / Indicators  Sore    Pain Type  Surgical pain    Pain Onset  More than a month ago    Pain Frequency  Intermittent         [facility] PT Assessment - 07/28/19 0001      AROM   Right Ankle Dorsiflexion  0    Right Ankle Plantar Flexion  35    Right Ankle Inversion  8    Right Ankle Eversion  10                   [facility] Adult PT Treatment/Exercise - 07/28/19 0001      Exercises   Exercises  Ankle;Knee/Hip      Manual Therapy   Manual Therapy  Joint mobilization;Passive ROM    Joint Mobilization  Talocrural and subtalar  distraction and grade III post/ant mobs, mulligan MWM for ankle DF in 1/2 kneeling position    Passive ROM  Ankle stretching in all directions      Ankle Exercises: Seated   BAPS  Sitting;Level 2    BAPS Limitations  2x10 fwd/bwd, lateral, circles    Other Seated Ankle Exercises  Plantarflexion, inversion, eversion stretching in figure-4 position 2x30 sec each      Ankle Exercises: Stretches   Soleus Stretch  2 reps;30 seconds    Soleus Stretch Limitations  standing    Gastroc Stretch  2 reps;30 seconds    Gastroc Stretch Limitations  standing    Other Stretch  Standing ankle DF mobs with foot on step using a strap pulling posterior/inferior x10      Ankle Exercises: Aerobic   Nustep  L5 x 5 min UE/LE with focus on keep foot flat for ankle mobility             PT Education - 07/28/19 1259    Education Details  HEP, gait mechanics, CAM boot use,  stretching    Person(s) Educated  Patient    Methods  Explanation;Demonstration;Verbal cues    Comprehension  Verbalized understanding;Returned demonstration;Verbal cues required;Need further instruction       PT Short Term Goals - 07/28/19 1219      PT SHORT TERM GOAL #1   Title  pt to be I with initial HEP    Time  4    Period  Weeks    Status  Achieved    Target Date  07/24/19      PT SHORT TERM GOAL #2   Title  increase Gross R ankle ROM by >/= 5 degrees in all planes with </= 4/10 to promtoe functional ROM    Time  4    Period  Weeks    Status  Achieved      PT SHORT TERM GOAL #3   Title  reduce R ankle edema by >/= 4 cm to promote ankle ROM and reduce pain for functional imporovement    Time  4    Period  Weeks    Status  On-going    Target Date  07/24/19        PT Long Term Goals - 07/28/19 1220      PT LONG TERM GOAL #1   Title  pt to increaes R ankle DF to >/= 8 degrees, PF to >/= 45 degrees and inversion/ eversion WFL to promote functional and efficent gait    Time  8    Period  Weeks    Status   On-going      PT LONG TERM GOAL #2   Title  increase R ankle strength to >/= 4+/5 in all planes to promote stability with walking/ standing    Time  8    Period  Weeks    Status  On-going      PT LONG TERM GOAL #3   Title  pt to be able to walk/ stand >/= 45 min and navigate >/= 15 steps for functional mobility/ endurance required for work related activities.    Time  8    Period  Weeks    Status  On-going      PT LONG TERM GOAL #4   Title  pt to be I with all HEP given as of last visit to maintain and progress current level of function    Time  6    Period  Weeks    Status  New            Plan - 07/28/19 1151    Clinical Impression Statement  Patient continues to slowly progress his ankle range of motion. He reports benefit from manual therapy to improve mobility and reduce stiffness. Therapy focused mainly on improving range of motion this visit. He continues to exhibit swelling of the right ankle and was encouraged to use his boot when he is sore and elevate/ice/compress the ankle to control swelling. He would benefit from continued skilled PT to progress his range of motion and strength to improve gait and return to PLOF.    PT Treatment/Interventions  ADLs/Self Care Home Management;Cryotherapy;Electrical Stimulation;Iontophoresis '4mg'$ /ml Dexamethasone;Moist Heat;Traction;Ultrasound;Stair training;Gait training;Therapeutic exercise;Therapeutic activities;Balance training;Neuromuscular re-education;Manual techniques;Patient/family education;Passive range of motion;Taping;Vasopneumatic Device    PT Next Visit Plan  review/ update HEP PRN, ankle ROM/ mobs, edema reduction techniques, muscle activation/strenghening, game ready for pain/ edema    PT Home Exercise Plan  Self ankle DF and PF mobs with band, seated PF/Ev/Inv stretch in figure-4 position, ABCs with  ankle elevated, weight shifts in all directions, 4-way hip, towel scrunches, standing gastroc and soleus stretch    Consulted  and Agree with Plan of Care  Patient       Patient will benefit from skilled therapeutic intervention in order to improve the following deficits and impairments:  Abnormal gait, Decreased activity tolerance, Decreased balance, Decreased endurance, Decreased strength, Improper body mechanics, Decreased range of motion, Pain, Increased edema, Postural dysfunction  Visit Diagnosis: Pain in right ankle and joints of right foot  Muscle weakness (generalized)  Localized edema  Abnormal gait     Problem List Patient Active Problem List   Diagnosis Date Noted  . Closed right pilon fracture 05/15/2019  . Closed fracture of right distal fibula 05/15/2019    Hilda Blades, PT, DPT, LAT, ATC 07/28/19  1:03 PM Phone: [phone] Fax: [facility] [address] [place], [place], [address] Phone: [phone]   Fax:  [phone]  Name: Ernest Mcfarland MRN: [id number] Date of Birth: July 27, 1981   PHYSICAL THERAPY DISCHARGE SUMMARY  Visits from Start of Care: 8  Current functional level related to goals / functional outcomes: Patient was continuing to have difficulty with ambulation and mobility   Remaining deficits: Motion, strength, pain, swelling, ambulation   Education / Equipment: HEP, bands Plan:                                                    Patient goals were not met. Patient is being discharged due to not returning since the last visit.  ?????     Hilda Blades, PT, DPT, LAT, ATC 09/16/19  2:28 PM Phone: [phone] Fax: [phone]

## 2019-08-06 ENCOUNTER — Ambulatory Visit (HOSPITAL_COMMUNITY)
Admission: EM | Admit: 2019-08-06 | Discharge: 2019-08-06 | Disposition: A | Discharge status: Home / Self Care | Payer: Medicaid Other | Attending: Family Medicine | Admitting: Family Medicine

## 2019-08-06 ENCOUNTER — Other Ambulatory Visit: Payer: Self-pay

## 2019-08-06 ENCOUNTER — Encounter (HOSPITAL_COMMUNITY): Payer: Self-pay | Admitting: Emergency Medicine

## 2019-08-06 DIAGNOSIS — R0981 Nasal congestion: Secondary | ICD-10-CM | POA: Diagnosis present

## 2019-08-06 DIAGNOSIS — U071 COVID-19: Secondary | ICD-10-CM | POA: Insufficient documentation

## 2019-08-06 DIAGNOSIS — R058 Other specified cough: Secondary | ICD-10-CM

## 2019-08-06 DIAGNOSIS — R059 Cough, unspecified: Secondary | ICD-10-CM

## 2019-08-06 DIAGNOSIS — Z20828 Contact with and (suspected) exposure to other viral communicable diseases: Secondary | ICD-10-CM

## 2019-08-06 DIAGNOSIS — R062 Wheezing: Secondary | ICD-10-CM | POA: Insufficient documentation

## 2019-08-06 DIAGNOSIS — R0982 Postnasal drip: Secondary | ICD-10-CM

## 2019-08-06 DIAGNOSIS — R05 Cough: Secondary | ICD-10-CM | POA: Diagnosis present

## 2019-08-06 MED ORDER — BENZONATATE 100 MG PO CAPS: 100.0000 mg | ORAL_CAPSULE | Freq: Three times a day (TID) | ORAL | 0 refills | Status: DC | PRN

## 2019-08-06 MED ORDER — PROMETHAZINE-DM 6.25-15 MG/5ML PO SYRP: 5.0000 mL | ORAL_SOLUTION | Freq: Four times a day (QID) | ORAL | 0 refills | Status: DC | PRN

## 2019-08-06 MED ORDER — PREDNISONE 10 MG PO TABS: 30.0000 mg | ORAL_TABLET | Freq: Every day | ORAL | 0 refills | Status: DC

## 2019-08-06 NOTE — ED Triage Notes (Signed)
Wheezing with sleeping or lying down for a month.  Patient has concern for a cold for a month.    Reports coughing will lying down, reports clearing his throat frequently -these symptoms for 2-3 days.
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
## 2019-08-06 NOTE — Discharge Instructions (Signed)
For sore throat or cough try using a honey-based tea. Use 3 teaspoons of honey with juice squeezed from half lemon. Place shaved pieces of ginger into 1/2-1 cup of water and warm over stove top. Then mix the ingredients and repeat every 4 hours as needed. Please take Tylenol 500mg every 6 hours. Hydrate very well with at least 2 liters of water. Eat light meals such as soups to replenish electrolytes and soft fruits, veggies. Start an antihistamine like Zyrtec, Allegra or Claritin for postnasal drainage, sinus congestion.  You can take this together with pseudoephedrine (Sudafed) at a dose of 60 mg 3 times a day or 120 mg twice daily as needed for the same kind of congestion.    

## 2019-08-08 LAB — NOVEL CORONAVIRUS, NAA (HOSP ORDER, SEND-OUT TO REF LAB; TAT 18-24 HRS): SARS-CoV-2, NAA: DETECTED — AB

## 2019-08-11 ENCOUNTER — Telehealth (HOSPITAL_COMMUNITY): Payer: Self-pay | Admitting: Emergency Medicine

## 2019-08-11 NOTE — Telephone Encounter (Signed)

## 2019-09-11 ENCOUNTER — Ambulatory Visit (HOSPITAL_COMMUNITY)
Admission: EM | Admit: 2019-09-11 | Discharge: 2019-09-11 | Disposition: A | Discharge status: Home / Self Care | Payer: Medicaid Other | Attending: Family Medicine | Admitting: Family Medicine

## 2019-09-11 ENCOUNTER — Other Ambulatory Visit: Payer: Self-pay

## 2019-09-11 ENCOUNTER — Encounter (HOSPITAL_COMMUNITY): Payer: Self-pay

## 2019-09-11 DIAGNOSIS — M62838 Other muscle spasm: Secondary | ICD-10-CM | POA: Diagnosis not present

## 2019-09-11 MED ORDER — DICLOFENAC SODIUM 75 MG PO TBEC: 75.0000 mg | DELAYED_RELEASE_TABLET | Freq: Two times a day (BID) | ORAL | 0 refills | Status: DC

## 2019-09-11 MED ORDER — DEXAMETHASONE SODIUM PHOSPHATE 10 MG/ML IJ SOLN
INTRAMUSCULAR | Status: AC
  Filled 2019-09-11: qty 1

## 2019-09-11 MED ORDER — CYCLOBENZAPRINE HCL 10 MG PO TABS: ORAL_TABLET | ORAL | 0 refills | Status: DC

## 2019-09-11 MED ORDER — DEXAMETHASONE SODIUM PHOSPHATE 10 MG/ML IJ SOLN
10.0000 mg | Freq: Once | INTRAMUSCULAR | Status: AC
  Administered 2019-09-11: 10 mg via INTRAMUSCULAR

## 2019-09-11 NOTE — ED Triage Notes (Signed)
Pt states he has left shoulder pain and neck discomfort. Pt states he has

## 2019-09-15 NOTE — ED Provider Notes (Signed)
[facility]   [id number] 09/11/19 Arrival Time: 0263  ASSESSMENT & PLAN:  1. Trapezius muscle spasm     No indication for imaging at this time.  Begin: Meds ordered this encounter  Medications  . diclofenac (VOLTAREN) 75 MG EC tablet    Sig: Take 1 tablet (75 mg total) by mouth 2 (two) times daily.    Dispense:  14 tablet    Refill:  0  . cyclobenzaprine (FLEXERIL) 10 MG tablet    Sig: Take 1 tablet by mouth 3 times daily as needed for muscle spasm. Warning: May cause drowsiness.    Dispense:  21 tablet    Refill:  0  . dexamethasone (DECADRON) injection 10 mg    ROM as tolerated. Activities as tolerated.  Recommend: Follow-up Information    Schedule an appointment as soon as possible for a visit  with [facility].   Contact information: [address] [place] [phone]          Reviewed expectations re: course of current medical issues. Questions answered. Outlined signs and symptoms indicating need for more acute intervention. Patient verbalized understanding. After Visit Summary given.  SUBJECTIVE: History from: patient. Ernest Mcfarland is a 39 y.o. male who reports left shoulder/upper back discomfort. Gradual onset; several days to week. Moderate at times. Aches. No injury/trauma. No extremity sensation changes or weakness. OTC without relief. No associated CP/SOB. Aggravating factors: certain movements. Alleviating factors: rest. History of similar: no.  Past Surgical History:  Procedure Laterality Date  . ORIF FIBULA FRACTURE Right 05/15/2019   Procedure: OPEN REDUCTION INTERNAL FIXATION (ORIF) PILON AND  FIBULA FRACTURE;  Surgeon: Nicholes Stairs, MD;  Location: [facility];  Service: Orthopedics;  Laterality: Right;     ROS: As per HPI. All other systems negative.    OBJECTIVE:  Vitals:   09/11/19 1648 09/11/19 1649  BP: 130/89   Pulse: 81   Resp: 18   Temp: 98.3 F  (36.8 C)   TempSrc: Oral   SpO2: 99%   Weight:  97.5 kg    General appearance: alert; no distress HEENT: [place]; AT Neck: supple with FROM Resp: unlabored respirations Extremities: . LUE: warm with well perfused appearance; poorly localized tenderness over L trapezius; L shoulder with FROM; no bony TTP CV: brisk extremity capillary refill of LUE; 2+ radial pulse of LUE. Skin: warm and dry; no visible rashes Neurologic: gait normal; normal reflexes of LUE; normal sensation of LUE; normal strength of LUE Psychological: alert and cooperative; normal mood and affect    No Known Allergies   Social History   Socioeconomic History  . Marital status: Single    Spouse name: Not on file  . Number of children: Not on file  . Years of education: Not on file  . Highest education level: Not on file  Occupational History  . Not on file  Tobacco Use  . Smoking status: Former Smoker    Packs/day: 1.00    Years: 4.00    Pack years: 4.00    Types: Cigarettes    Quit date: 2008    Years since quitting: 13.0  . Smokeless tobacco: Never Used  Substance and Sexual Activity  . Alcohol use: Yes    Comment: weekend 12pack  . Drug use: Never  . Sexual activity: Not on file  Other Topics Concern  . Not on file  Social History Narrative  . Not on file   Social  Determinants of Health   Financial Resource Strain:   . Difficulty of Paying Living Expenses: Not on file  Food Insecurity:   . Worried About Programme researcher, broadcasting/film/video in the Last Year: Not on file  . Ran Out of Food in the Last Year: Not on file  Transportation Needs:   . Lack of Transportation (Medical): Not on file  . Lack of Transportation (Non-Medical): Not on file  Physical Activity:   . Days of Exercise per Week: Not on file  . Minutes of Exercise per Session: Not on file  Stress:   . Feeling of Stress : Not on file  Social Connections:   . Frequency of Communication with Friends and Family: Not on file  . Frequency of  Social Gatherings with Friends and Family: Not on file  . Attends Religious Services: Not on file  . Active Member of Clubs or Organizations: Not on file  . Attends Banker Meetings: Not on file  . Marital Status: Not on file   Family History  Problem Relation Age of Onset  . Hypertension Mother    Past Surgical History:  Procedure Laterality Date  . ORIF FIBULA FRACTURE Right 05/15/2019   Procedure: OPEN REDUCTION INTERNAL FIXATION (ORIF) PILON AND  FIBULA FRACTURE;  Surgeon: Yolonda Kida, MD;  Location: [facility] OR;  Service: Orthopedics;  Laterality: Right;      Mardella Layman, MD 09/15/19 1642

## 2019-11-10 ENCOUNTER — Encounter (HOSPITAL_COMMUNITY): Payer: Self-pay | Admitting: Emergency Medicine

## 2019-11-10 ENCOUNTER — Other Ambulatory Visit: Payer: Self-pay

## 2019-11-10 ENCOUNTER — Emergency Department (HOSPITAL_COMMUNITY)
Admission: EM | Admit: 2019-11-10 | Discharge: 2019-11-10 | Disposition: A | Discharge status: Home / Self Care | Payer: Medicaid Other | Attending: Emergency Medicine | Admitting: Emergency Medicine

## 2019-11-10 ENCOUNTER — Emergency Department (HOSPITAL_COMMUNITY): Payer: Medicaid Other

## 2019-11-10 ENCOUNTER — Ambulatory Visit (HOSPITAL_COMMUNITY)
Admission: EM | Admit: 2019-11-10 | Discharge: 2019-11-10 | Disposition: A | Discharge status: Home / Self Care | Payer: Medicaid Other | Attending: Urgent Care | Admitting: Urgent Care

## 2019-11-10 DIAGNOSIS — S0012XA Contusion of left eyelid and periocular area, initial encounter: Secondary | ICD-10-CM | POA: Insufficient documentation

## 2019-11-10 DIAGNOSIS — Y939 Activity, unspecified: Secondary | ICD-10-CM | POA: Insufficient documentation

## 2019-11-10 DIAGNOSIS — Y929 Unspecified place or not applicable: Secondary | ICD-10-CM | POA: Diagnosis not present

## 2019-11-10 DIAGNOSIS — S0181XA Laceration without foreign body of other part of head, initial encounter: Secondary | ICD-10-CM | POA: Insufficient documentation

## 2019-11-10 DIAGNOSIS — Z23 Encounter for immunization: Secondary | ICD-10-CM | POA: Insufficient documentation

## 2019-11-10 DIAGNOSIS — R220 Localized swelling, mass and lump, head: Secondary | ICD-10-CM | POA: Insufficient documentation

## 2019-11-10 DIAGNOSIS — Z87891 Personal history of nicotine dependence: Secondary | ICD-10-CM | POA: Insufficient documentation

## 2019-11-10 DIAGNOSIS — H5789 Other specified disorders of eye and adnexa: Secondary | ICD-10-CM | POA: Diagnosis not present

## 2019-11-10 DIAGNOSIS — Y999 Unspecified external cause status: Secondary | ICD-10-CM | POA: Insufficient documentation

## 2019-11-10 DIAGNOSIS — S0993XA Unspecified injury of face, initial encounter: Secondary | ICD-10-CM | POA: Diagnosis present

## 2019-11-10 DIAGNOSIS — H547 Unspecified visual loss: Secondary | ICD-10-CM

## 2019-11-10 DIAGNOSIS — S01312A Laceration without foreign body of left ear, initial encounter: Secondary | ICD-10-CM | POA: Diagnosis not present

## 2019-11-10 MED ORDER — ACETAMINOPHEN 325 MG PO TABS
650.0000 mg | ORAL_TABLET | Freq: Once | ORAL | Status: DC
  Filled 2019-11-10: qty 2

## 2019-11-10 MED ORDER — LIDOCAINE-EPINEPHRINE-TETRACAINE (LET) TOPICAL GEL
3.0000 mL | Freq: Once | TOPICAL | Status: AC
  Administered 2019-11-10: 3 mL via TOPICAL
  Filled 2019-11-10: qty 3

## 2019-11-10 MED ORDER — HYDROCODONE-ACETAMINOPHEN 5-325 MG PO TABS
1.0000 | ORAL_TABLET | Freq: Once | ORAL | Status: AC
  Administered 2019-11-10: 1 via ORAL

## 2019-11-10 MED ORDER — TETANUS-DIPHTH-ACELL PERTUSSIS 5-2.5-18.5 LF-MCG/0.5 IM SUSP
0.5000 mL | Freq: Once | INTRAMUSCULAR | Status: AC
  Administered 2019-11-10: 0.5 mL via INTRAMUSCULAR
  Filled 2019-11-10: qty 0.5

## 2019-11-10 MED ORDER — HYDROCODONE-ACETAMINOPHEN 5-325 MG PO TABS
ORAL_TABLET | ORAL | Status: AC
  Filled 2019-11-10: qty 1

## 2019-11-10 MED ORDER — FLUORESCEIN SODIUM 1 MG OP STRP
1.0000 | ORAL_STRIP | Freq: Once | OPHTHALMIC | Status: AC
  Administered 2019-11-10: 1 via OPHTHALMIC
  Filled 2019-11-10: qty 1

## 2019-11-10 MED ORDER — ACETAMINOPHEN 500 MG PO TABS: 1000.0000 mg | ORAL_TABLET | Freq: Once | ORAL | Status: DC

## 2019-11-10 MED ORDER — CEPHALEXIN 500 MG PO CAPS: 500.0000 mg | ORAL_CAPSULE | Freq: Four times a day (QID) | ORAL | 0 refills | Status: AC

## 2019-11-10 MED ORDER — TETRACAINE HCL 0.5 % OP SOLN
2.0000 [drp] | Freq: Once | OPHTHALMIC | Status: AC
  Administered 2019-11-10: 2 [drp] via OPHTHALMIC
  Filled 2019-11-10: qty 4

## 2019-11-10 NOTE — ED Notes (Signed)
Attempted to do visual acuity screen w/ pt holding L eye open. Pt stated that he cant see anything straight ahead due to a big black dot blocking his vision. pt can see peripherally
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
## 2019-11-10 NOTE — ED Notes (Signed)
Pt verbalized understanding of discharge instructions. Pt instructed to go directly to Dr. Laruth Bouchard office, pt's ride is outside. Prescriptions and pain management reviewed.
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
## 2019-11-10 NOTE — ED Notes (Signed)
Visual acuity screening performed. Pt unable to open L eye to complete screening. RN made aware.
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
## 2019-11-10 NOTE — Discharge Instructions (Addendum)
You were given a prescription for antibiotics. Please take the antibiotic prescription fully.   Go directly to Dr. Laruth Bouchard office to have your eye examined   Please return to the emergency department for any new or worsening symptoms.

## 2019-11-10 NOTE — Discharge Instructions (Signed)
Please report to the emergency room. You need an orbital CT to rule out an eye fracture. We have given you hydrocodone for pain. It is okay to use Tylenol until you are evaluated further in the ER for your pain.

## 2021-05-28 IMAGING — RF DG ANKLE COMPLETE 3+V*R*
1 series · 8 of 8 positions shown · non-contrast
Comparison: 05/01/2019

CLINICAL DATA: ORIF of right ankle fracture

EXAM:
DG C-ARM 1-60 MIN; RIGHT ANKLE - COMPLETE 3+ VIEW

[Series 1: run · 8 of 8 slices shown]
[im 1/8]
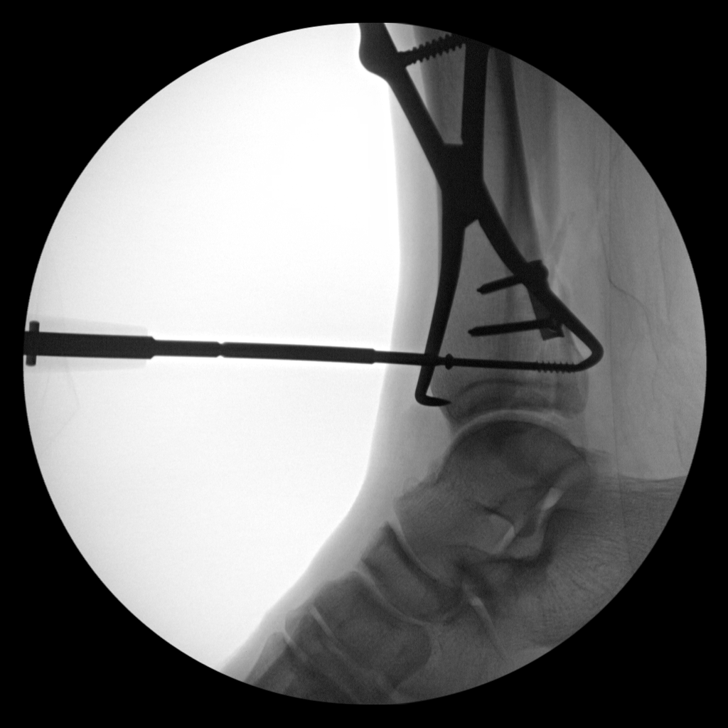
[im 2/8]
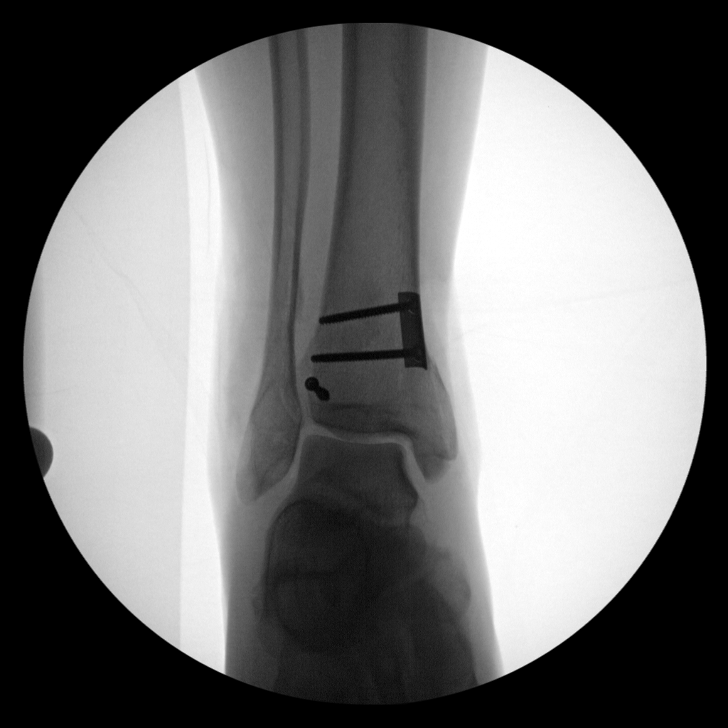
[im 3/8]
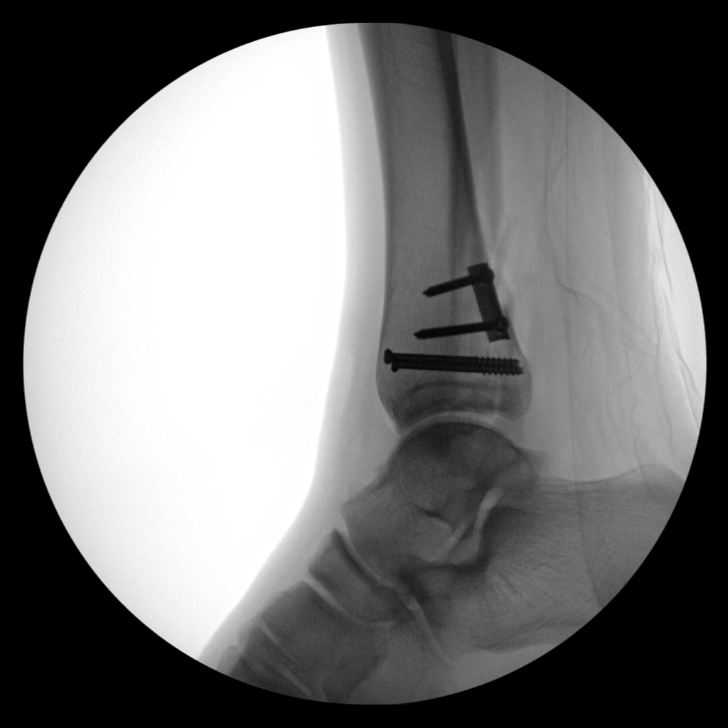
[im 4/8]
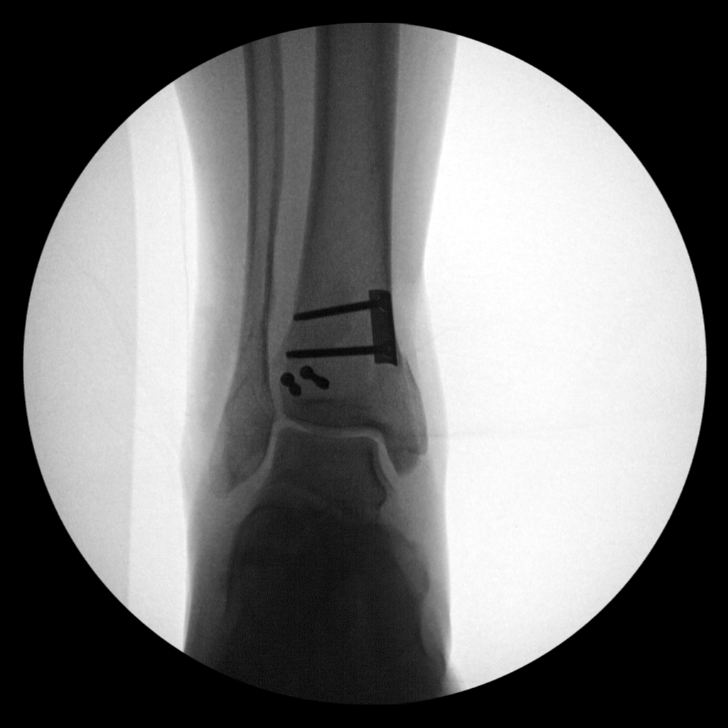
[im 5/8]
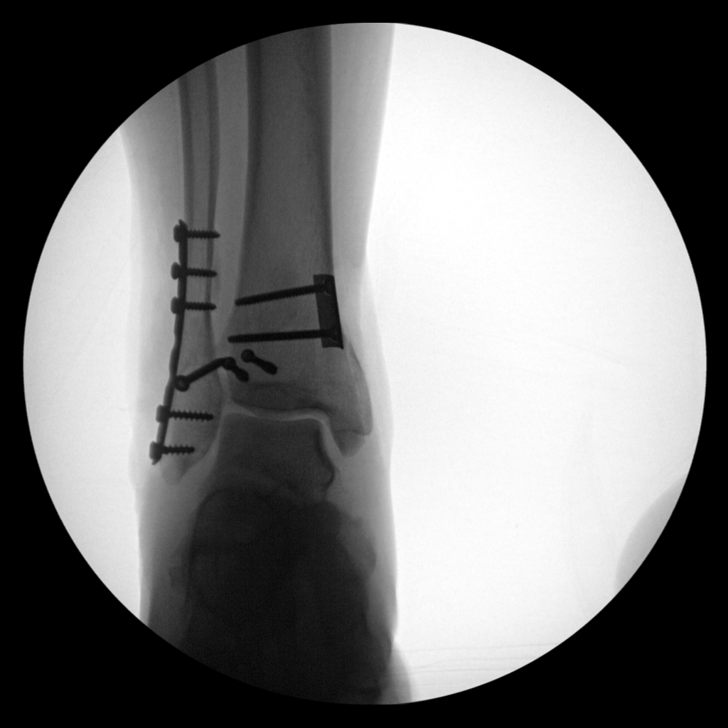
[im 6/8]
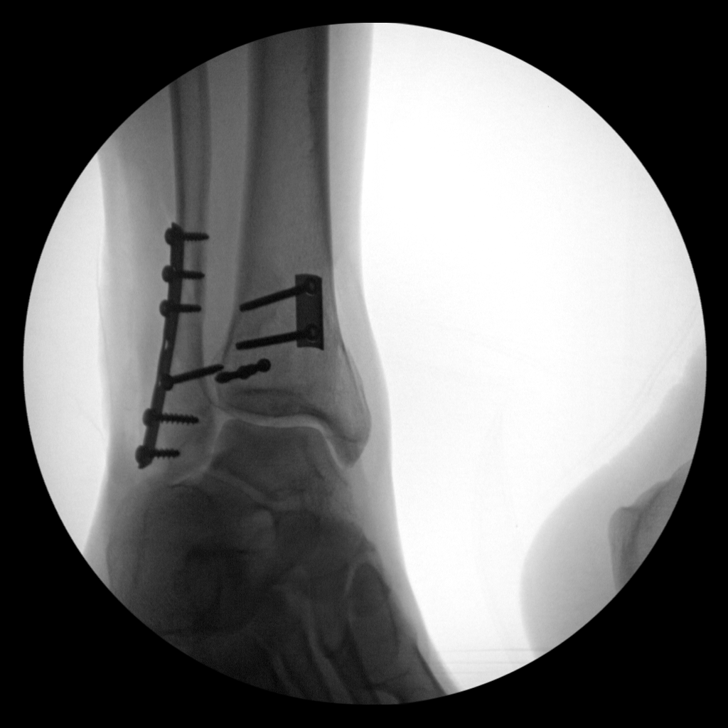
[im 7/8]
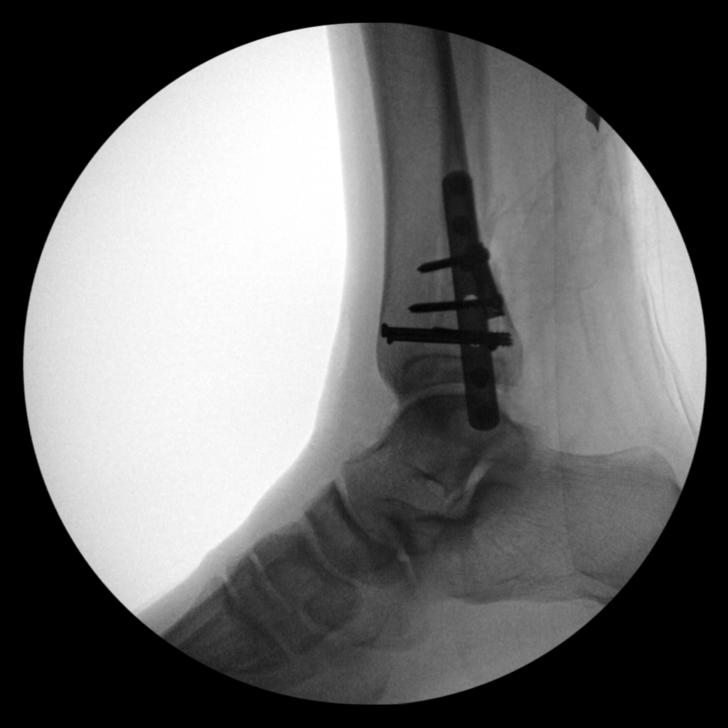
[im 8/8]
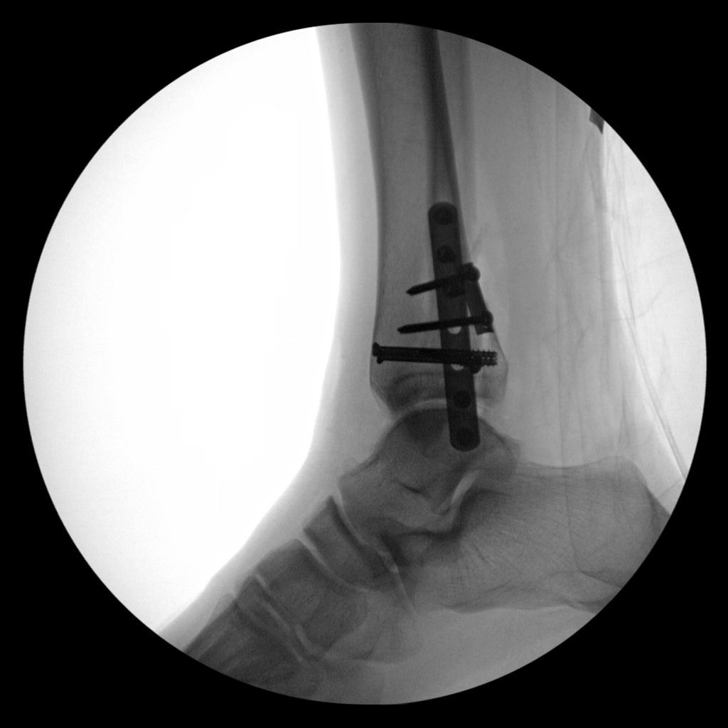

[8 of 8 positions shown; findings below may reference images not displayed]

FLUOROSCOPY TIME:  Fluoroscopy Time:  37 seconds

Radiation Exposure Index (if provided by the fluoroscopic device):
Not available

Number of Acquired Spot Images: 8
FINDINGS: Multiple fixation screws are noted in the distal tibia with near
anatomic alignment of the fracture fragments. Subsequent fixation
sideplate along the distal fibula is noted.
IMPRESSION: ORIF of distal tibial and fibular fractures

## 2023-04-09 ENCOUNTER — Other Ambulatory Visit: Payer: Self-pay | Admitting: Internal Medicine

## 2023-04-09 DIAGNOSIS — R2243 Localized swelling, mass and lump, lower limb, bilateral: Secondary | ICD-10-CM

## 2023-04-10 ENCOUNTER — Ambulatory Visit
Admission: RE | Admit: 2023-04-10 | Discharge: 2023-04-10 | Disposition: A | Discharge status: Home / Self Care | Payer: Medicaid Other | Source: Ambulatory Visit | Attending: Internal Medicine | Admitting: Internal Medicine

## 2023-04-10 DIAGNOSIS — R2243 Localized swelling, mass and lump, lower limb, bilateral: Secondary | ICD-10-CM
